# Patient Record
Sex: Female | Born: 1980 | Race: White | Hispanic: No | Marital: Married | Smoking: Never smoker
Health system: Southern US, Community
[De-identification: ages and names within clinical notes are randomized; demographics above are authoritative.]

## PROBLEM LIST (undated history)

## (undated) ENCOUNTER — Emergency Department (HOSPITAL_COMMUNITY): Admission: EM | Payer: BC Managed Care – PPO | Source: Home / Self Care

## (undated) DIAGNOSIS — F32A Depression, unspecified: Secondary | ICD-10-CM

## (undated) DIAGNOSIS — F419 Anxiety disorder, unspecified: Secondary | ICD-10-CM

## (undated) DIAGNOSIS — K589 Irritable bowel syndrome without diarrhea: Secondary | ICD-10-CM

## (undated) DIAGNOSIS — N809 Endometriosis, unspecified: Secondary | ICD-10-CM

## (undated) DIAGNOSIS — E282 Polycystic ovarian syndrome: Secondary | ICD-10-CM

## (undated) DIAGNOSIS — J45909 Unspecified asthma, uncomplicated: Secondary | ICD-10-CM

## (undated) DIAGNOSIS — T7840XA Allergy, unspecified, initial encounter: Secondary | ICD-10-CM

## (undated) DIAGNOSIS — R011 Cardiac murmur, unspecified: Secondary | ICD-10-CM

## (undated) DIAGNOSIS — G43909 Migraine, unspecified, not intractable, without status migrainosus: Secondary | ICD-10-CM

## (undated) DIAGNOSIS — K219 Gastro-esophageal reflux disease without esophagitis: Secondary | ICD-10-CM

## (undated) HISTORY — DX: Allergy, unspecified, initial encounter: T78.40XA

## (undated) HISTORY — DX: Irritable bowel syndrome, unspecified: K58.9

## (undated) HISTORY — DX: Endometriosis, unspecified: N80.9

## (undated) HISTORY — DX: Anxiety disorder, unspecified: F41.9

## (undated) HISTORY — PX: CERVIX LESION DESTRUCTION: SHX591

## (undated) HISTORY — PX: TRIGGER FINGER RELEASE: SHX641

## (undated) HISTORY — DX: Unspecified asthma, uncomplicated: J45.909

## (undated) HISTORY — PX: BARIATRIC SURGERY: SHX1103

## (undated) HISTORY — PX: WISDOM TOOTH EXTRACTION: SHX21

## (undated) HISTORY — DX: Migraine, unspecified, not intractable, without status migrainosus: G43.909

## (undated) HISTORY — DX: Depression, unspecified: F32.A

## (undated) HISTORY — PX: ANTERIOR CRUCIATE LIGAMENT REPAIR: SHX115

## (undated) HISTORY — DX: Cardiac murmur, unspecified: R01.1

## (undated) HISTORY — DX: Gastro-esophageal reflux disease without esophagitis: K21.9

---

## 2009-06-11 ENCOUNTER — Emergency Department (HOSPITAL_COMMUNITY): Admission: EM | Admit: 2009-06-11 | Discharge: 2009-06-11 | Payer: Self-pay | Admitting: Family Medicine

## 2009-11-27 ENCOUNTER — Emergency Department (HOSPITAL_COMMUNITY): Admission: EM | Admit: 2009-11-27 | Discharge: 2009-11-27 | Payer: Self-pay | Admitting: Emergency Medicine

## 2010-04-26 ENCOUNTER — Encounter: Admission: RE | Admit: 2010-04-26 | Discharge: 2010-04-26 | Payer: Self-pay | Admitting: Allergy and Immunology

## 2010-10-08 LAB — STOOL CULTURE

## 2010-10-08 LAB — CLOSTRIDIUM DIFFICILE EIA

## 2010-10-08 LAB — GIARDIA/CRYPTOSPORIDIUM SCREEN(EIA)

## 2010-10-08 LAB — HEMOCCULT GUIAC POC 1CARD (OFFICE): Fecal Occult Bld: POSITIVE

## 2011-03-16 ENCOUNTER — Emergency Department (HOSPITAL_COMMUNITY)
Admission: EM | Admit: 2011-03-16 | Discharge: 2011-03-16 | Discharge status: Absent without leave | Payer: BC Managed Care – PPO | Source: Home / Self Care | Attending: Emergency Medicine | Admitting: Emergency Medicine

## 2011-03-16 ENCOUNTER — Emergency Department (HOSPITAL_COMMUNITY)
Admission: EM | Admit: 2011-03-16 | Discharge: 2011-03-17 | Disposition: A | Discharge status: Home / Self Care | Payer: BC Managed Care – PPO | Attending: Emergency Medicine | Admitting: Emergency Medicine

## 2011-03-16 ENCOUNTER — Ambulatory Visit (HOSPITAL_COMMUNITY)
Admission: RE | Admit: 2011-03-16 | Discharge: 2011-03-16 | Disposition: A | Discharge status: Home / Self Care | Payer: BC Managed Care – PPO | Source: Ambulatory Visit | Attending: Emergency Medicine | Admitting: Emergency Medicine

## 2011-03-16 DIAGNOSIS — R109 Unspecified abdominal pain: Secondary | ICD-10-CM | POA: Insufficient documentation

## 2011-03-16 DIAGNOSIS — M546 Pain in thoracic spine: Secondary | ICD-10-CM | POA: Insufficient documentation

## 2011-03-16 DIAGNOSIS — K219 Gastro-esophageal reflux disease without esophagitis: Secondary | ICD-10-CM | POA: Insufficient documentation

## 2011-03-16 DIAGNOSIS — R110 Nausea: Secondary | ICD-10-CM | POA: Insufficient documentation

## 2011-03-16 LAB — URINALYSIS, ROUTINE W REFLEX MICROSCOPIC
Ketones, ur: 15 mg/dL — AB
Leukocytes, UA: NEGATIVE
Nitrite: NEGATIVE
Protein, ur: NEGATIVE mg/dL

## 2011-03-17 LAB — COMPREHENSIVE METABOLIC PANEL
Albumin: 3.8 g/dL (ref 3.5–5.2)
BUN: 14 mg/dL (ref 6–23)
Chloride: 99 mEq/L (ref 96–112)
Creatinine, Ser: 0.66 mg/dL (ref 0.50–1.10)
GFR calc Af Amer: >60 mL/min (ref >60)
Total Bilirubin: 0.3 mg/dL (ref 0.3–1.2)
Total Protein: 7.5 g/dL (ref 6.0–8.3)

## 2011-03-17 LAB — DIFFERENTIAL
Eosinophils Relative: 0 % (ref 0–5)
Lymphocytes Relative: 16 % (ref 12–46)
Lymphs Abs: 1.6 10*3/uL (ref 0.7–4.0)
Monocytes Absolute: 0.5 10*3/uL (ref 0.1–1.0)

## 2011-03-17 LAB — LIPASE, BLOOD: Lipase: 28 U/L (ref 11–59)

## 2011-03-17 LAB — CBC
HCT: 40.2 % (ref 36.0–46.0)
MCHC: 33.8 g/dL (ref 30.0–36.0)
MCV: 84.8 fL (ref 78.0–100.0)
RDW: 13.6 % (ref 11.5–15.5)

## 2011-03-20 ENCOUNTER — Other Ambulatory Visit: Payer: Self-pay | Admitting: Family Medicine

## 2011-03-20 ENCOUNTER — Ambulatory Visit
Admission: RE | Admit: 2011-03-20 | Discharge: 2011-03-20 | Disposition: A | Discharge status: Home / Self Care | Payer: BC Managed Care – PPO | Source: Ambulatory Visit | Attending: Family Medicine | Admitting: Family Medicine

## 2011-03-20 DIAGNOSIS — R1011 Right upper quadrant pain: Secondary | ICD-10-CM

## 2011-03-25 ENCOUNTER — Other Ambulatory Visit (HOSPITAL_COMMUNITY): Payer: Self-pay | Admitting: Family Medicine

## 2011-03-25 DIAGNOSIS — R1013 Epigastric pain: Secondary | ICD-10-CM

## 2011-04-07 ENCOUNTER — Encounter (HOSPITAL_COMMUNITY)
Admission: RE | Admit: 2011-04-07 | Discharge: 2011-04-07 | Disposition: A | Discharge status: Home / Self Care | Payer: BC Managed Care – PPO | Source: Ambulatory Visit | Attending: Family Medicine | Admitting: Family Medicine

## 2011-04-07 DIAGNOSIS — R1013 Epigastric pain: Secondary | ICD-10-CM | POA: Insufficient documentation

## 2011-04-07 DIAGNOSIS — R110 Nausea: Secondary | ICD-10-CM | POA: Insufficient documentation

## 2011-04-07 MED ORDER — TECHNETIUM TC 99M MEBROFENIN IV KIT
5.2000 | PACK | Freq: Once | INTRAVENOUS | Status: AC | PRN
  Administered 2011-04-07: 5 via INTRAVENOUS

## 2011-07-28 ENCOUNTER — Inpatient Hospital Stay (HOSPITAL_COMMUNITY)
Admission: AD | Admit: 2011-07-28 | Discharge: 2011-07-28 | Disposition: A | Discharge status: Home / Self Care | Payer: BC Managed Care – PPO | Source: Ambulatory Visit | Attending: Obstetrics and Gynecology | Admitting: Obstetrics and Gynecology

## 2011-07-28 ENCOUNTER — Encounter (HOSPITAL_COMMUNITY): Payer: Self-pay | Admitting: *Deleted

## 2011-07-28 ENCOUNTER — Inpatient Hospital Stay (HOSPITAL_COMMUNITY): Payer: BC Managed Care – PPO

## 2011-07-28 DIAGNOSIS — N946 Dysmenorrhea, unspecified: Secondary | ICD-10-CM

## 2011-07-28 HISTORY — DX: Polycystic ovarian syndrome: E28.2

## 2011-07-28 LAB — URINALYSIS, ROUTINE W REFLEX MICROSCOPIC
Glucose, UA: NEGATIVE mg/dL
Leukocytes, UA: NEGATIVE
Protein, ur: NEGATIVE mg/dL
pH: 5.5 (ref 5.0–8.0)

## 2011-07-28 LAB — URINE MICROSCOPIC-ADD ON

## 2011-07-28 LAB — POCT PREGNANCY, URINE: Preg Test, Ur: NEGATIVE

## 2011-07-28 LAB — HCG, SERUM, QUALITATIVE: Preg, Serum: NEGATIVE

## 2011-07-28 MED ORDER — NAPROXEN SODIUM 550 MG PO TABS: 550.0000 mg | ORAL_TABLET | Freq: Two times a day (BID) | ORAL | Status: AC

## 2011-07-28 NOTE — ED Provider Notes (Signed)
History     Chief Complaint  Patient presents with  . Possible Pregnancy   HPI 31 y.o. G0P0 with left pelvic pain starting today, vaginal bleeding starting today. Trying to conceive, stopped OCPs at the end of November. Saw PCP at [facility] today, did pelvic exam, tender in left adnexa. Was told to follow up here.    Past Medical History  Diagnosis Date  . PCOS (polycystic ovarian syndrome)     Past Surgical History  Procedure Date  . Cervix lesion destruction   . Wisdom tooth extraction     Family History  Problem Relation Age of Onset  . COPD Mother   . Cancer Maternal Grandfather     History  Substance Use Topics  . Smoking status: Never Smoker   . Smokeless tobacco: Not on file  . Alcohol Use: Yes     socially    Allergies:  Allergies  Allergen Reactions  . Codeine Itching and Rash  . Sulfa Antibiotics Itching and Rash    Prescriptions prior to admission  Medication Sig Dispense Refill  . Lansoprazole (PREVACID PO) Take 1 tablet by mouth daily as needed. Patient is using this medication for heartburn.       . Prenatal Vit-Fe Fumarate-FA (PRENATAL MULTIVITAMIN) TABS Take 1 tablet by mouth daily.          Review of Systems  Constitutional: Negative.   Respiratory: Negative.   Cardiovascular: Negative.   Gastrointestinal: Positive for abdominal pain. Negative for nausea, vomiting, diarrhea and constipation.  Genitourinary: Negative for dysuria, urgency, frequency, hematuria and flank pain.       Positive for vaginal bleeding  Musculoskeletal: Negative.   Neurological: Negative.   Psychiatric/Behavioral: Negative.    Physical Exam   Blood pressure 131/83, pulse 79, temperature 99.2 F (37.3 C), temperature source Oral, resp. rate 20, height 5' 4.5" (1.638 m), weight 219 lb (99.338 kg), last menstrual period 06/30/2011.  Physical Exam  Nursing note and vitals reviewed. Constitutional: She appears well-developed and well-nourished. No distress.    Cardiovascular: Normal rate.   Respiratory: Effort normal.  GI: Soft. She exhibits no distension and no mass. There is no tenderness. There is no rebound and no guarding.  Genitourinary: Left adnexum displays tenderness.   Results for orders placed during the hospital encounter of 07/28/11 (from the past 24 hour(s))  URINALYSIS, ROUTINE W REFLEX MICROSCOPIC     Status: Abnormal   Collection Time   07/28/11  5:15 PM      Component Value Range   Color, Urine YELLOW  YELLOW    APPearance CLEAR  CLEAR    Specific Gravity, Urine 1.020  1.005 - 1.030    pH 5.5  5.0 - 8.0    Glucose, UA NEGATIVE  NEGATIVE (mg/dL)   Hgb urine dipstick LARGE (*) NEGATIVE    Bilirubin Urine NEGATIVE  NEGATIVE    Ketones, ur NEGATIVE  NEGATIVE (mg/dL)   Protein, ur NEGATIVE  NEGATIVE (mg/dL)   Urobilinogen, UA 0.2  0.0 - 1.0 (mg/dL)   Nitrite NEGATIVE  NEGATIVE    Leukocytes, UA NEGATIVE  NEGATIVE   URINE MICROSCOPIC-ADD ON     Status: Abnormal   Collection Time   07/28/11  5:15 PM      Component Value Range   Squamous Epithelial / LPF FEW (*) RARE    WBC, UA 0-2  <3 (WBC/hpf)   RBC / HPF 21-50  <3 (RBC/hpf)  POCT PREGNANCY, URINE     Status: Normal  Collection Time   07/28/11  5:37 PM      Component Value Range   Preg Test, Ur NEGATIVE    HCG, SERUM, QUALITATIVE     Status: Normal   Collection Time   07/28/11  6:00 PM      Component Value Range   Preg, Serum NEGATIVE  NEGATIVE    US Transvaginal Non-ob  07/28/2011  *RADIOLOGY REPORT*  Clinical Data: Left adnexal pain  TRANSABDOMINAL AND TRANSVAGINAL ULTRASOUND OF PELVIS Technique:  Both transabdominal and transvaginal ultrasound examinations of the pelvis were performed. Transabdominal technique was performed for global imaging of the pelvis including uterus, ovaries, adnexal regions, and pelvic cul-de-sac.  Comparison: None.   It was necessary to proceed with endovaginal exam following the transabdominal exam to visualize the adnexa.  Findings:  Uterus:  Within normal limits for size and appearance.  Anteverted. Normal echogenicity.  No focal mass.  Endometrium: Normal in thickness and appearance. Measures 6 mm maximal thickness.  Right ovary:  Not visualized.  No right adnexal mass is seen.  Left ovary: Measures 3.2 x 1.9 x 1.9 cm.  Contains approximately two follicles.  No cyst or mass.  Normal color Doppler flow is identified to the left ovary.  Other findings: No free fluid  IMPRESSION:  1.  Normal uterus and left ovary. 2.  The right ovary is not visualized.  No right adnexal mass is seen.  Original Report Authenticated By: Britta Mccreedy, M.D.   US Pelvis Complete  07/28/2011  *RADIOLOGY REPORT*  Clinical Data: Left adnexal pain  TRANSABDOMINAL AND TRANSVAGINAL ULTRASOUND OF PELVIS Technique:  Both transabdominal and transvaginal ultrasound examinations of the pelvis were performed. Transabdominal technique was performed for global imaging of the pelvis including uterus, ovaries, adnexal regions, and pelvic cul-de-sac.  Comparison: None.   It was necessary to proceed with endovaginal exam following the transabdominal exam to visualize the adnexa.  Findings:  Uterus: Within normal limits for size and appearance.  Anteverted. Normal echogenicity.  No focal mass.  Endometrium: Normal in thickness and appearance. Measures 6 mm maximal thickness.  Right ovary:  Not visualized.  No right adnexal mass is seen.  Left ovary: Measures 3.2 x 1.9 x 1.9 cm.  Contains approximately two follicles.  No cyst or mass.  Normal color Doppler flow is identified to the left ovary.  Other findings: No free fluid  IMPRESSION:  1.  Normal uterus and left ovary. 2.  The right ovary is not visualized.  No right adnexal mass is seen.  Original Report Authenticated By: Britta Mccreedy, M.D.    MAU Course  Procedures  Assessment and Plan  Pelvic u/s ordered, care assumed by [facility], NP  [facility] 07/28/2011, 7:45 PM   Patient returned from ultrasound, discussed lab and  ultrasound. Pt. To follow up with her doctor. Return here as needed.  Assessment: Dysmenorrhea  Plan:  Anaprox DS    Follow up with PCP.  [place], [place] 07/28/11 2119

## 2011-07-28 NOTE — Progress Notes (Signed)
Unsure if preg,  Woke up this morning with pain and then started bleeding, not like a period.  Pain  On left side with exam, ? Ectopic. Hasn't felt well all day, no fever.

## 2011-07-28 NOTE — Progress Notes (Signed)
Had been having breast changes, tenderness.  Was gone this morning.  Test this morning was neg, 2 prior indeterminate

## 2011-07-28 NOTE — Progress Notes (Signed)
Pelvic pain and amenorrhea, left lower abd pain, pt trying to conceive, sent here from office.

## 2011-07-29 NOTE — ED Provider Notes (Signed)
Agree with above note.  Regina Wise 07/29/2011 7:28 AM   

## 2012-07-01 ENCOUNTER — Ambulatory Visit
Admission: RE | Admit: 2012-07-01 | Discharge: 2012-07-01 | Disposition: A | Discharge status: Home / Self Care | Payer: BC Managed Care – PPO | Source: Ambulatory Visit

## 2012-07-01 ENCOUNTER — Other Ambulatory Visit: Payer: Self-pay

## 2012-07-01 DIAGNOSIS — R102 Pelvic and perineal pain: Secondary | ICD-10-CM

## 2012-07-02 ENCOUNTER — Ambulatory Visit
Admission: RE | Admit: 2012-07-02 | Discharge: 2012-07-02 | Disposition: A | Discharge status: Home / Self Care | Payer: BC Managed Care – PPO | Source: Ambulatory Visit

## 2012-07-02 ENCOUNTER — Other Ambulatory Visit: Payer: Self-pay

## 2012-07-02 DIAGNOSIS — R109 Unspecified abdominal pain: Secondary | ICD-10-CM

## 2013-03-29 ENCOUNTER — Other Ambulatory Visit: Payer: Self-pay | Admitting: Family Medicine

## 2013-03-29 ENCOUNTER — Ambulatory Visit
Admission: RE | Admit: 2013-03-29 | Discharge: 2013-03-29 | Disposition: A | Discharge status: Home / Self Care | Payer: BC Managed Care – PPO | Source: Ambulatory Visit | Attending: Family Medicine | Admitting: Family Medicine

## 2013-03-29 DIAGNOSIS — M79672 Pain in left foot: Secondary | ICD-10-CM

## 2013-05-23 ENCOUNTER — Ambulatory Visit (HOSPITAL_COMMUNITY)
Admission: RE | Admit: 2013-05-23 | Discharge: 2013-05-23 | Disposition: A | Discharge status: Home / Self Care | Payer: BC Managed Care – PPO | Attending: Psychiatry | Admitting: Psychiatry

## 2013-05-23 NOTE — BH Assessment (Signed)
Writer consulted with Lloyd Huger (PA) regarding this patient not meeting criteria for inpatient hospitalization.  Patient was referred to Intensive Outpatient Therapy at [facility].

## 2013-05-23 NOTE — BH Assessment (Signed)
Walk In ssessment Note   Patient is a 32 year old white female that reports increased level of stress and depression associated with workplace conflict.  Patient reports incidents of depression for the past 12 weeks. Patient reports slights SI without a plan.  Patient reports that she is able to contract for safety.   Patient denies a prior history of psychiatric hospitalization.  Patient denies a past history of SI.  Patient reports medication management at her family doctor [facility]).  Patient reports that the medication (Wellbuterin) does not seem to be working.     Patient reports that certain staff members have been ganging up on her and she feels as if she is being bullied while at work.  Patient reports that she has spoken to her union representative because she feels as if she is in a hostile workplace.  Patient endorsed increased weight gain, loss of sleep, chronic mood swings and depression.   Patient denies HI.  Patient denies psychosis.   Axis I: Major Depression, single episode Axis II: Deferred Axis III:  Past Medical History  Diagnosis Date  . PCOS (polycystic ovarian syndrome)    Axis IV: occupational problems, other psychosocial or environmental problems and problems related to social environment Axis V: 41-50 serious symptoms  Past Medical History:  Past Medical History  Diagnosis Date  . PCOS (polycystic ovarian syndrome)     Past Surgical History  Procedure Laterality Date  . Cervix lesion destruction    . Wisdom tooth extraction      Family History:  Family History  Problem Relation Age of Onset  . COPD Mother   . Cancer Maternal Grandfather     Social History:  reports that she has never smoked. She does not have any smokeless tobacco history on file. She reports that she drinks alcohol. She reports that she does not use illicit drugs.  Additional Social History:     CIWA:   COWS:    Allergies:  Allergies  Allergen Reactions  . Codeine Itching  and Rash  . Sulfa Antibiotics Itching and Rash    Home Medications:  (Not in a hospital admission)  OB/GYN Status:  No LMP recorded.  General Assessment Data Location of Assessment: [facility] Services Is this a Tele or Face-to-Face Assessment?: Face-to-Face Is this an Initial Assessment or a Re-assessment for this encounter?: Initial Assessment Living Arrangements: Spouse/significant other Can pt return to current living arrangement?: Yes Admission Status: Voluntary Is patient capable of signing voluntary admission?: Yes Transfer from: Home Referral Source: Self/Family/Friend  Medical Screening Exam [facility] Walk-in ONLY) Medical Exam completed: No Reason for MSE not completed: Patient Refused  [facility] Crisis Care Plan Living Arrangements: Spouse/significant other  Education Status Is patient currently in school?: No  Risk to self Suicidal Ideation: Yes-Currently Present Suicidal Intent: No Is patient at risk for suicide?: No Suicidal Plan?: No Access to Means: No What has been your use of drugs/alcohol within the last 12 months?: None Previous Attempts/Gestures: No How many times?: 0 Other Self Harm Risks: None Triggers for Past Attempts: Unpredictable Intentional Self Injurious Behavior: None Family Suicide History: No Recent stressful life event(s): Conflict (Comment) (Problems at work with co workers and superiors) Persecutory voices/beliefs?: No Depression: Yes Depression Symptoms: Despondent;Insomnia;Tearfulness;Isolating;Loss of interest in usual pleasures;Feeling worthless/self pity;Feeling angry/irritable;Fatigue Substance abuse history and/or treatment for substance abuse?: No Suicide prevention information given to non-admitted patients: Yes  Risk to Others Homicidal Ideation: No Thoughts of Harm to Others: No Current Homicidal Intent: No Current  Homicidal Plan: No Access to Homicidal Means: No Identified Victim: None History of harm to others?:  No Assessment of Violence: None Noted Violent Behavior Description: calm Does patient have access to weapons?: No Criminal Charges Pending?: No Does patient have a court date: No  Psychosis Hallucinations: None noted Delusions: None noted  Mental Status Report Appear/Hygiene: Disheveled Eye Contact: Good Motor Activity: Freedom of movement Speech: Logical/coherent Level of Consciousness: Alert Mood: Depressed Affect: Blunted Anxiety Level: None Thought Processes: Coherent;Relevant Judgement: Unimpaired Orientation: Place;Person;Time;Situation Obsessive Compulsive Thoughts/Behaviors: None  Cognitive Functioning Concentration: Decreased Memory: Recent Intact;Remote Intact IQ: Average Insight: Fair Impulse Control: Fair Appetite: Fair Weight Loss: 0 Weight Gain: 50 Sleep: Decreased Total Hours of Sleep: 4 Vegetative Symptoms: Staying in bed  ADLScreening [facility] Assessment Services) Patient's cognitive ability adequate to safely complete daily activities?: Yes Patient able to express need for assistance with ADLs?: Yes Independently performs ADLs?: Yes (appropriate for developmental age)  Prior Inpatient Therapy Prior Inpatient Therapy: No Prior Therapy Dates: na Prior Therapy Facilty/Provider(s): na Reason for Treatment: na  Prior Outpatient Therapy Prior Outpatient Therapy: Yes Prior Therapy Dates: ongoing  Prior Therapy Facilty/Provider(s): [facility] Reason for Treatment: Medication Management and Therapy  ADL Screening (condition at time of admission) Patient's cognitive ability adequate to safely complete daily activities?: Yes Patient able to express need for assistance with ADLs?: Yes Independently performs ADLs?: Yes (appropriate for developmental age)                  Additional Information 1:1 In Past 12 Months?: No CIRT Risk: No Elopement Risk: No Does patient have medical clearance?: Yes     Disposition:   Disposition Initial Assessment Completed for this Encounter: Yes Disposition of Patient: Other dispositions Type of inpatient treatment program:  (N/A) Other disposition(s): Other (Comment);Referred to outside facility ([facility])  On Site Evaluation by:   Reviewed with Physician:    Regina Wise 05/23/2013 12:14 PM

## 2013-06-21 ENCOUNTER — Emergency Department (HOSPITAL_COMMUNITY)
Admission: EM | Admit: 2013-06-21 | Discharge: 2013-06-21 | Disposition: A | Discharge status: Home / Self Care | Payer: BC Managed Care – PPO | Attending: Emergency Medicine | Admitting: Emergency Medicine

## 2013-06-21 ENCOUNTER — Emergency Department (HOSPITAL_COMMUNITY): Payer: BC Managed Care – PPO

## 2013-06-21 ENCOUNTER — Encounter (HOSPITAL_COMMUNITY): Payer: Self-pay | Admitting: Emergency Medicine

## 2013-06-21 DIAGNOSIS — N949 Unspecified condition associated with female genital organs and menstrual cycle: Secondary | ICD-10-CM | POA: Insufficient documentation

## 2013-06-21 DIAGNOSIS — R319 Hematuria, unspecified: Secondary | ICD-10-CM | POA: Insufficient documentation

## 2013-06-21 DIAGNOSIS — R102 Pelvic and perineal pain: Secondary | ICD-10-CM

## 2013-06-21 DIAGNOSIS — Z8742 Personal history of other diseases of the female genital tract: Secondary | ICD-10-CM | POA: Insufficient documentation

## 2013-06-21 DIAGNOSIS — R1012 Left upper quadrant pain: Secondary | ICD-10-CM | POA: Insufficient documentation

## 2013-06-21 DIAGNOSIS — R109 Unspecified abdominal pain: Secondary | ICD-10-CM

## 2013-06-21 DIAGNOSIS — Z79899 Other long term (current) drug therapy: Secondary | ICD-10-CM | POA: Insufficient documentation

## 2013-06-21 DIAGNOSIS — R300 Dysuria: Secondary | ICD-10-CM

## 2013-06-21 LAB — URINALYSIS, ROUTINE W REFLEX MICROSCOPIC
Bilirubin Urine: NEGATIVE
Glucose, UA: NEGATIVE mg/dL
Ketones, ur: >80 mg/dL — AB
Nitrite: NEGATIVE
Protein, ur: NEGATIVE mg/dL
Specific Gravity, Urine: 1.024 (ref 1.005–1.030)
Urobilinogen, UA: 1 mg/dL (ref 0.0–1.0)
pH: 5.5 (ref 5.0–8.0)

## 2013-06-21 LAB — URINE MICROSCOPIC-ADD ON

## 2013-06-21 LAB — CBC WITH DIFFERENTIAL/PLATELET
Basophils Absolute: 0 10*3/uL (ref 0.0–0.1)
Basophils Relative: 0 % (ref 0–1)
HCT: 42.4 % (ref 36.0–46.0)
Hemoglobin: 14.1 g/dL (ref 12.0–15.0)
Lymphocytes Relative: 15 % (ref 12–46)
MCHC: 33.3 g/dL (ref 30.0–36.0)
Neutro Abs: 10.9 10*3/uL — ABNORMAL HIGH (ref 1.7–7.7)
Neutrophils Relative %: 81 % — ABNORMAL HIGH (ref 43–77)
RDW: 14.7 % (ref 11.5–15.5)
WBC: 13.5 10*3/uL — ABNORMAL HIGH (ref 4.0–10.5)

## 2013-06-21 LAB — COMPREHENSIVE METABOLIC PANEL
ALT: 24 U/L (ref 0–35)
AST: 22 U/L (ref 0–37)
Albumin: 4.2 g/dL (ref 3.5–5.2)
Alkaline Phosphatase: 67 U/L (ref 39–117)
CO2: 24 mEq/L (ref 19–32)
Chloride: 99 mEq/L (ref 96–112)
GFR calc non Af Amer: >90 mL/min (ref >90)
Potassium: 4 mEq/L (ref 3.5–5.1)
Total Bilirubin: 0.3 mg/dL (ref 0.3–1.2)

## 2013-06-21 LAB — POCT PREGNANCY, URINE: Preg Test, Ur: NEGATIVE

## 2013-06-21 MED ORDER — IOHEXOL 300 MG/ML  SOLN: 50.0000 mL | Freq: Once | INTRAMUSCULAR | Status: DC | PRN

## 2013-06-21 MED ORDER — ONDANSETRON HCL 4 MG/2ML IJ SOLN
4.0000 mg | Freq: Once | INTRAMUSCULAR | Status: AC
  Administered 2013-06-21: 4 mg via INTRAVENOUS
  Filled 2013-06-21: qty 2

## 2013-06-21 MED ORDER — CEPHALEXIN 500 MG PO CAPS: 500.0000 mg | ORAL_CAPSULE | Freq: Four times a day (QID) | ORAL | Status: DC

## 2013-06-21 MED ORDER — OXYCODONE-ACETAMINOPHEN 5-325 MG PO TABS: 1.0000 | ORAL_TABLET | ORAL | Status: DC | PRN

## 2013-06-21 MED ORDER — MORPHINE SULFATE 4 MG/ML IJ SOLN
4.0000 mg | Freq: Once | INTRAMUSCULAR | Status: AC
  Administered 2013-06-21: 4 mg via INTRAVENOUS
  Filled 2013-06-21: qty 1

## 2013-06-21 MED ORDER — PHENAZOPYRIDINE HCL 200 MG PO TABS: 200.0000 mg | ORAL_TABLET | Freq: Three times a day (TID) | ORAL | Status: DC

## 2013-06-21 NOTE — ED Notes (Signed)
Pt c/o lower abdomen pain x 3 days. Got worse today. Went to [facility] and had an ultrasound and UA and nothing was found. Denies n/v/d but has been constipated over the last 3 days.

## 2013-06-21 NOTE — Progress Notes (Signed)
   CARE MANAGEMENT ED NOTE 06/21/2013  Patient:  Regina Wise, Regina Wise   Account Number:  [id number]  Date Initiated:  06/21/2013  Documentation initiated by:  Radford Pax  Subjective/Objective Assessment:   Patient presents to Ed with abominal pain for three days.     Subjective/Objective Assessment Detail:     Action/Plan:   Action/Plan Detail:   Anticipated DC Date:       Status Recommendation to Physician:   Result of Recommendation:    Other ED Services  Consult Working Plan    DC Planning Services  Other  PCP issues    Choice offered to / List presented to:            Status of service:  Completed, signed off  ED Comments:   ED Comments Detail:  Patient confirms her pcp is Dr. Hyman Hopes of [facility].  System updated.

## 2013-06-21 NOTE — ED Provider Notes (Signed)
CSN: [id number]     Arrival date & time 06/21/13  1437 History   First MD Initiated Contact with Patient 06/21/13 1531     Chief Complaint  Patient presents with  . Abdominal Pain   (Consider location/radiation/quality/duration/timing/severity/associated sxs/prior Treatment) HPI Comments: Patient presents to the ER for evaluation of abdominal pain. Patient reports that symptoms began 3 days ago. She reports that she has been feeling constipated and having left upper abdominal pain which has been crampy and intermittent. She also had some dull aching and burning pain in the area of her vagina and urethra. The patient reports that she saw her OB/GYN this morning. She had a pelvic exam, urinalysis and ultrasound. She was told everything looked normal. Since then, however, she has had onset of severe, constant pain in the area of the vagina. It hurts more when she is about to urinate.  Patient is a 32 y.o. female presenting with abdominal pain.  Abdominal Pain Associated symptoms: dysuria and hematuria     Past Medical History  Diagnosis Date  . PCOS (polycystic ovarian syndrome)    Past Surgical History  Procedure Laterality Date  . Cervix lesion destruction    . Wisdom tooth extraction     Family History  Problem Relation Age of Onset  . COPD Mother   . Cancer Maternal Grandfather    History  Substance Use Topics  . Smoking status: Never Smoker   . Smokeless tobacco: Not on file  . Alcohol Use: Yes     Comment: socially   OB History   Grav Para Term Preterm Abortions TAB SAB Ect Mult Living   0              Review of Systems  Gastrointestinal: Positive for abdominal pain.  Genitourinary: Positive for dysuria and hematuria.  All other systems reviewed and are negative.    Allergies  Pineapple; Codeine; and Sulfa antibiotics  Home Medications   Current Outpatient Rx  Name  Route  Sig  Dispense  Refill  . buPROPion (WELLBUTRIN XL) 300 MG 24 hr tablet   Oral   Take  300 mg by mouth daily.         . lansoprazole (PREVACID) 30 MG capsule   Oral   Take 30 mg by mouth daily at 12 noon.         . norethindrone-ethinyl estradiol (NECON,BREVICON,MODICON) 0.5-35 MG-MCG tablet   Oral   Take 1 tablet by mouth daily.         . Prenatal Vit-Fe Fumarate-FA (PRENATAL MULTIVITAMIN) TABS   Oral   Take 1 tablet by mouth daily.           . Sennosides (EX-LAX PO)   Oral   Take 2 tablets by mouth daily as needed (constipation).          BP 123/89  Pulse 112  Temp(Src) 97.7 F (36.5 C) (Oral)  Resp 20  SpO2 100%  LMP 06/01/2013 Physical Exam  Constitutional: She is oriented to person, place, and time. She appears well-developed and well-nourished. No distress.  HENT:  Head: Normocephalic and atraumatic.  Right Ear: Hearing normal.  Left Ear: Hearing normal.  Nose: Nose normal.  Mouth/Throat: Oropharynx is clear and moist and mucous membranes are normal.  Eyes: Conjunctivae and EOM are normal. Pupils are equal, round, and reactive to light.  Neck: Normal range of motion. Neck supple.  Cardiovascular: Regular rhythm, S1 normal and S2 normal.  Exam reveals no gallop and no friction rub.  No murmur heard. Pulmonary/Chest: Effort normal and breath sounds normal. No respiratory distress. She exhibits no tenderness.  Abdominal: Soft. Normal appearance and bowel sounds are normal. There is no hepatosplenomegaly. There is no tenderness. There is no rebound, no guarding, no tenderness at McBurney's point and negative Murphy's sign. No hernia.  Musculoskeletal: Normal range of motion.  Neurological: She is alert and oriented to person, place, and time. She has normal strength. No cranial nerve deficit or sensory deficit. Coordination normal. GCS eye subscore is 4. GCS verbal subscore is 5. GCS motor subscore is 6.  Skin: Skin is warm, dry and intact. No rash noted. No cyanosis.  Psychiatric: She has a normal mood and affect. Her speech is normal and  behavior is normal. Thought content normal.    ED Course  Procedures (including critical care time) Labs Review Labs Reviewed  CBC WITH DIFFERENTIAL - Abnormal; Notable for the following:    WBC 13.5 (*)    Neutrophils Relative % 81 (*)    Neutro Abs 10.9 (*)    All other components within normal limits  COMPREHENSIVE METABOLIC PANEL - Abnormal; Notable for the following:    Total Protein 8.7 (*)    All other components within normal limits  URINALYSIS, ROUTINE W REFLEX MICROSCOPIC - Abnormal; Notable for the following:    APPearance CLOUDY (*)    Hgb urine dipstick SMALL (*)    Ketones, ur >80 (*)    Leukocytes, UA TRACE (*)    All other components within normal limits  LIPASE, BLOOD  URINE MICROSCOPIC-ADD ON  POCT PREGNANCY, URINE   Imaging Review Ct Abdomen Pelvis Wo Contrast  06/21/2013   CLINICAL DATA:  Three days of lower abdominal discomfort and constipation, normal outside pelvic ultrasound earlier today  EXAM: CT ABDOMEN AND PELVIS WITHOUT CONTRAST  TECHNIQUE: Multidetector CT imaging of the abdomen and pelvis was performed following the standard protocol without intravenous contrast.  COMPARISON:  CT scan of the abdomen and pelvis dated 13 2013.  FINDINGS: The kidneys are normal in density and contour. No calcified stones nor hydronephrosis is demonstrated. The perinephric fat is normal in appearance. Along the expected course of the ureters no abnormal calcifications are demonstrated. There is a phlebolith in the right adnexal region which is a stable finding. The uterus and adnexal structures appear normal. The partially distended urinary bladder is grossly normal.  The liver exhibits no focal mass nor ductal dilation. The gallbladder is adequately distended and exhibits no calcified stones. The spleen, nondistended stomach, pancreas, and adrenal glands are normal in appearance. The caliber of the abdominal aorta is normal. There is no periaortic or pericaval lymphadenopathy.   The non-opacified loops of small and large bowel exhibit no evidence of ileus nor obstruction. A normal caliber uninflamed appearing appendix is demonstrated on images 70-72 of series 2. The lumbar vertebral bodies are preserved in height. The bony pelvis is normal in appearance. The lung bases are clear.  IMPRESSION: 1. There is no evidence of urinary tract stones nor obstruction. There are no findings to suggest pyelonephritis based on the normal appearance of the perinephric fat. No acute gynecological abnormality is demonstrated. 2. No acute hepatobiliary abnormality is demonstrated. 3. There is no evidence of bowel obstruction or ileus. No significant stool burden is present. A normal caliber uninflamed appearing appendix is demonstrated. 4. There is no evidence of intra-abdominal or pelvic free fluid, abscess formation, or lymphadenopathy.   Electronically Signed   By: David  [place]   On:  06/21/2013 17:06    EKG Interpretation   None       MDM   1. Abdominal pain   2. Pelvic pain   3. Dysuria    Patient with abdominal and pelvic pain. Patient has been expressing abdominal pain for several weeks. She was seen by GI and had upper endoscopy, no acute findings. Today she was seen by her gynecologist and had a pelvic exam and pelvic ultrasound without findings. Patient has had increased pain today, specifically says the pain is in the area of her urethra. She has increased pain with urination. Urinalysis is equivocal. CAT scan was performed to further evaluate. No acute abnormalities are seen. No signs of pyelonephritis, ureterolithiasis. No appendicitis, diverticulitis or other inflammatory process. Patient's pain did begin to improve spontaneously prior to administration of the morphine and now she feels well. Cannot rule out passed kidney stone, but there is no residual hydronephrosis or hydroureter. As the patient has had significant dysuria, will treat empirically with Keflex while culture is  pending. Prescribed Pyridium and Percocet to be used as needed for pain. She has followup with GI scheduled for later in the week.    Gilda Crease, MD 06/21/13 1730

## 2018-04-21 NOTE — Progress Notes (Signed)
Ms Hickson received her flu shot by the undersigned to LT deltoid:  Lot #[id number] Mfg: [id number] NDC:58160-896-41 Exp:01/18/19

## 2019-06-22 ENCOUNTER — Other Ambulatory Visit: Payer: Self-pay

## 2019-06-22 DIAGNOSIS — Z20822 Contact with and (suspected) exposure to covid-19: Secondary | ICD-10-CM

## 2019-06-25 LAB — NOVEL CORONAVIRUS, NAA: SARS-CoV-2, NAA: NOT DETECTED

## 2020-03-25 DIAGNOSIS — Z20822 Contact with and (suspected) exposure to covid-19: Secondary | ICD-10-CM | POA: Diagnosis not present

## 2020-04-04 DIAGNOSIS — R194 Change in bowel habit: Secondary | ICD-10-CM | POA: Diagnosis not present

## 2020-04-04 DIAGNOSIS — R1031 Right lower quadrant pain: Secondary | ICD-10-CM | POA: Diagnosis not present

## 2020-04-04 DIAGNOSIS — R1032 Left lower quadrant pain: Secondary | ICD-10-CM | POA: Diagnosis not present

## 2020-04-16 DIAGNOSIS — R194 Change in bowel habit: Secondary | ICD-10-CM | POA: Diagnosis not present

## 2020-04-16 DIAGNOSIS — K5900 Constipation, unspecified: Secondary | ICD-10-CM | POA: Diagnosis not present

## 2020-04-16 DIAGNOSIS — K648 Other hemorrhoids: Secondary | ICD-10-CM | POA: Diagnosis not present

## 2020-05-07 DIAGNOSIS — K5902 Outlet dysfunction constipation: Secondary | ICD-10-CM | POA: Diagnosis not present

## 2020-05-20 DIAGNOSIS — R059 Cough, unspecified: Secondary | ICD-10-CM | POA: Diagnosis not present

## 2020-05-20 DIAGNOSIS — R509 Fever, unspecified: Secondary | ICD-10-CM | POA: Diagnosis not present

## 2020-05-20 DIAGNOSIS — J0110 Acute frontal sinusitis, unspecified: Secondary | ICD-10-CM | POA: Diagnosis not present

## 2020-05-20 DIAGNOSIS — Z20822 Contact with and (suspected) exposure to covid-19: Secondary | ICD-10-CM | POA: Diagnosis not present

## 2020-05-30 DIAGNOSIS — Z6841 Body Mass Index (BMI) 40.0 and over, adult: Secondary | ICD-10-CM | POA: Diagnosis not present

## 2020-05-30 DIAGNOSIS — Z01419 Encounter for gynecological examination (general) (routine) without abnormal findings: Secondary | ICD-10-CM | POA: Diagnosis not present

## 2020-05-31 DIAGNOSIS — Z23 Encounter for immunization: Secondary | ICD-10-CM | POA: Diagnosis not present

## 2020-05-31 DIAGNOSIS — Z0000 Encounter for general adult medical examination without abnormal findings: Secondary | ICD-10-CM | POA: Diagnosis not present

## 2020-05-31 DIAGNOSIS — Z13220 Encounter for screening for lipoid disorders: Secondary | ICD-10-CM | POA: Diagnosis not present

## 2020-05-31 DIAGNOSIS — Z5181 Encounter for therapeutic drug level monitoring: Secondary | ICD-10-CM | POA: Diagnosis not present

## 2020-06-11 DIAGNOSIS — F419 Anxiety disorder, unspecified: Secondary | ICD-10-CM | POA: Diagnosis not present

## 2020-06-11 DIAGNOSIS — M6289 Other specified disorders of muscle: Secondary | ICD-10-CM | POA: Diagnosis not present

## 2020-06-11 DIAGNOSIS — M62838 Other muscle spasm: Secondary | ICD-10-CM | POA: Diagnosis not present

## 2020-06-11 DIAGNOSIS — M6281 Muscle weakness (generalized): Secondary | ICD-10-CM | POA: Diagnosis not present

## 2020-07-27 DIAGNOSIS — M67912 Unspecified disorder of synovium and tendon, left shoulder: Secondary | ICD-10-CM | POA: Diagnosis not present

## 2020-08-03 DIAGNOSIS — M62838 Other muscle spasm: Secondary | ICD-10-CM | POA: Diagnosis not present

## 2020-08-03 DIAGNOSIS — R1030 Lower abdominal pain, unspecified: Secondary | ICD-10-CM | POA: Diagnosis not present

## 2020-08-03 DIAGNOSIS — M6281 Muscle weakness (generalized): Secondary | ICD-10-CM | POA: Diagnosis not present

## 2020-08-03 DIAGNOSIS — M6289 Other specified disorders of muscle: Secondary | ICD-10-CM | POA: Diagnosis not present

## 2020-08-14 DIAGNOSIS — K582 Mixed irritable bowel syndrome: Secondary | ICD-10-CM | POA: Diagnosis not present

## 2020-08-14 DIAGNOSIS — F419 Anxiety disorder, unspecified: Secondary | ICD-10-CM | POA: Diagnosis not present

## 2020-08-14 DIAGNOSIS — K5904 Chronic idiopathic constipation: Secondary | ICD-10-CM | POA: Diagnosis not present

## 2020-08-14 DIAGNOSIS — F840 Autistic disorder: Secondary | ICD-10-CM | POA: Diagnosis not present

## 2020-08-17 DIAGNOSIS — M67912 Unspecified disorder of synovium and tendon, left shoulder: Secondary | ICD-10-CM | POA: Diagnosis not present

## 2020-08-20 DIAGNOSIS — M6281 Muscle weakness (generalized): Secondary | ICD-10-CM | POA: Diagnosis not present

## 2020-08-20 DIAGNOSIS — M62838 Other muscle spasm: Secondary | ICD-10-CM | POA: Diagnosis not present

## 2020-08-20 DIAGNOSIS — M6289 Other specified disorders of muscle: Secondary | ICD-10-CM | POA: Diagnosis not present

## 2020-08-20 DIAGNOSIS — K5900 Constipation, unspecified: Secondary | ICD-10-CM | POA: Diagnosis not present

## 2020-09-03 DIAGNOSIS — M25512 Pain in left shoulder: Secondary | ICD-10-CM | POA: Diagnosis not present

## 2020-09-05 DIAGNOSIS — M67912 Unspecified disorder of synovium and tendon, left shoulder: Secondary | ICD-10-CM | POA: Diagnosis not present

## 2020-09-21 DIAGNOSIS — M6289 Other specified disorders of muscle: Secondary | ICD-10-CM | POA: Diagnosis not present

## 2020-09-21 DIAGNOSIS — Z23 Encounter for immunization: Secondary | ICD-10-CM | POA: Diagnosis not present

## 2020-09-21 DIAGNOSIS — M62838 Other muscle spasm: Secondary | ICD-10-CM | POA: Diagnosis not present

## 2020-09-21 DIAGNOSIS — N393 Stress incontinence (female) (male): Secondary | ICD-10-CM | POA: Diagnosis not present

## 2020-09-21 DIAGNOSIS — R350 Frequency of micturition: Secondary | ICD-10-CM | POA: Diagnosis not present

## 2020-09-21 DIAGNOSIS — S6992XA Unspecified injury of left wrist, hand and finger(s), initial encounter: Secondary | ICD-10-CM | POA: Diagnosis not present

## 2020-10-03 DIAGNOSIS — J0101 Acute recurrent maxillary sinusitis: Secondary | ICD-10-CM | POA: Diagnosis not present

## 2020-10-08 ENCOUNTER — Other Ambulatory Visit: Payer: Self-pay

## 2020-10-08 ENCOUNTER — Encounter (HOSPITAL_BASED_OUTPATIENT_CLINIC_OR_DEPARTMENT_OTHER): Payer: Self-pay | Admitting: Obstetrics and Gynecology

## 2020-10-08 ENCOUNTER — Emergency Department (HOSPITAL_BASED_OUTPATIENT_CLINIC_OR_DEPARTMENT_OTHER): Payer: BC Managed Care – PPO | Admitting: Radiology

## 2020-10-08 ENCOUNTER — Emergency Department (HOSPITAL_BASED_OUTPATIENT_CLINIC_OR_DEPARTMENT_OTHER)
Admission: EM | Admit: 2020-10-08 | Discharge: 2020-10-08 | Disposition: A | Discharge status: Home / Self Care | Payer: BC Managed Care – PPO | Attending: Emergency Medicine | Admitting: Emergency Medicine

## 2020-10-08 DIAGNOSIS — M25561 Pain in right knee: Secondary | ICD-10-CM | POA: Diagnosis not present

## 2020-10-08 DIAGNOSIS — M79661 Pain in right lower leg: Secondary | ICD-10-CM | POA: Diagnosis not present

## 2020-10-08 DIAGNOSIS — Z041 Encounter for examination and observation following transport accident: Secondary | ICD-10-CM | POA: Diagnosis not present

## 2020-10-08 DIAGNOSIS — Y92410 Unspecified street and highway as the place of occurrence of the external cause: Secondary | ICD-10-CM | POA: Insufficient documentation

## 2020-10-08 DIAGNOSIS — S20312A Abrasion of left front wall of thorax, initial encounter: Secondary | ICD-10-CM | POA: Diagnosis not present

## 2020-10-08 DIAGNOSIS — S81011A Laceration without foreign body, right knee, initial encounter: Secondary | ICD-10-CM

## 2020-10-08 MED ORDER — TRAMADOL HCL 50 MG PO TABS: 50.0000 mg | ORAL_TABLET | Freq: Four times a day (QID) | ORAL | 0 refills | Status: DC | PRN

## 2020-10-08 MED ORDER — TRAMADOL HCL 50 MG PO TABS
50.0000 mg | ORAL_TABLET | Freq: Once | ORAL | Status: AC
Start: 2020-10-08 — End: 2020-10-08
  Administered 2020-10-08: 50 mg via ORAL
  Filled 2020-10-08: qty 1

## 2020-10-08 MED ORDER — LIDOCAINE-EPINEPHRINE (PF) 2 %-1:200000 IJ SOLN
10.0000 mL | Freq: Once | INTRAMUSCULAR | Status: AC
  Administered 2020-10-08: 10 mL
  Filled 2020-10-08: qty 20
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
## 2020-10-08 NOTE — Discharge Instructions (Addendum)
You have been seen and discharged from the emergency department.  Your right knee laceration was repaired with 2 Prolene sutures.  Keep this wound wrapped and dry for 24 hours.  After that you may unwrap the wound, let soapy water run over it in the shower, pat to dry.  The sutures will need to be removed by your primary doctor in 7 days.  Continue taking your oral antibiotic as directed.  Remember to rest, elevate and ice the knee.  If you are still having knee pain and difficulty ambulating need to follow-up with your primary doctor and orthopedics for further imaging/evaluation. Take pain medicine as directed.  Do not mix this pain medicine with alcohol, it could be sedating.  Take home medications as prescribed. If you have any worsening symptoms or further concerns for health please return to an emergency department for further evaluation.
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
## 2020-10-09 DIAGNOSIS — M25561 Pain in right knee: Secondary | ICD-10-CM | POA: Diagnosis not present

## 2020-10-16 DIAGNOSIS — Z4802 Encounter for removal of sutures: Secondary | ICD-10-CM | POA: Diagnosis not present

## 2020-10-18 DIAGNOSIS — M25561 Pain in right knee: Secondary | ICD-10-CM | POA: Diagnosis not present

## 2020-10-24 DIAGNOSIS — M25561 Pain in right knee: Secondary | ICD-10-CM | POA: Diagnosis not present

## 2020-11-13 DIAGNOSIS — S46012D Strain of muscle(s) and tendon(s) of the rotator cuff of left shoulder, subsequent encounter: Secondary | ICD-10-CM | POA: Diagnosis not present

## 2020-11-13 DIAGNOSIS — M25612 Stiffness of left shoulder, not elsewhere classified: Secondary | ICD-10-CM | POA: Diagnosis not present

## 2020-11-13 DIAGNOSIS — M6281 Muscle weakness (generalized): Secondary | ICD-10-CM | POA: Diagnosis not present

## 2020-11-30 DIAGNOSIS — S83511A Sprain of anterior cruciate ligament of right knee, initial encounter: Secondary | ICD-10-CM | POA: Diagnosis not present

## 2020-12-11 DIAGNOSIS — F331 Major depressive disorder, recurrent, moderate: Secondary | ICD-10-CM | POA: Diagnosis not present

## 2020-12-20 DIAGNOSIS — X58XXXA Exposure to other specified factors, initial encounter: Secondary | ICD-10-CM | POA: Diagnosis not present

## 2020-12-20 DIAGNOSIS — S83281A Other tear of lateral meniscus, current injury, right knee, initial encounter: Secondary | ICD-10-CM | POA: Diagnosis not present

## 2020-12-20 DIAGNOSIS — G8918 Other acute postprocedural pain: Secondary | ICD-10-CM | POA: Diagnosis not present

## 2020-12-20 DIAGNOSIS — Y999 Unspecified external cause status: Secondary | ICD-10-CM | POA: Diagnosis not present

## 2020-12-20 DIAGNOSIS — S83511A Sprain of anterior cruciate ligament of right knee, initial encounter: Secondary | ICD-10-CM | POA: Diagnosis not present

## 2020-12-20 DIAGNOSIS — S83261A Peripheral tear of lateral meniscus, current injury, right knee, initial encounter: Secondary | ICD-10-CM | POA: Diagnosis not present

## 2020-12-26 DIAGNOSIS — Z9889 Other specified postprocedural states: Secondary | ICD-10-CM | POA: Diagnosis not present

## 2020-12-27 DIAGNOSIS — F331 Major depressive disorder, recurrent, moderate: Secondary | ICD-10-CM | POA: Diagnosis not present

## 2021-01-01 DIAGNOSIS — M25661 Stiffness of right knee, not elsewhere classified: Secondary | ICD-10-CM | POA: Diagnosis not present

## 2021-01-01 DIAGNOSIS — S83511D Sprain of anterior cruciate ligament of right knee, subsequent encounter: Secondary | ICD-10-CM | POA: Diagnosis not present

## 2021-01-01 DIAGNOSIS — M6281 Muscle weakness (generalized): Secondary | ICD-10-CM | POA: Diagnosis not present

## 2021-01-03 DIAGNOSIS — M25661 Stiffness of right knee, not elsewhere classified: Secondary | ICD-10-CM | POA: Diagnosis not present

## 2021-01-03 DIAGNOSIS — S83511D Sprain of anterior cruciate ligament of right knee, subsequent encounter: Secondary | ICD-10-CM | POA: Diagnosis not present

## 2021-01-03 DIAGNOSIS — M6281 Muscle weakness (generalized): Secondary | ICD-10-CM | POA: Diagnosis not present

## 2021-01-08 DIAGNOSIS — M6281 Muscle weakness (generalized): Secondary | ICD-10-CM | POA: Diagnosis not present

## 2021-01-08 DIAGNOSIS — M25661 Stiffness of right knee, not elsewhere classified: Secondary | ICD-10-CM | POA: Diagnosis not present

## 2021-01-08 DIAGNOSIS — S83511D Sprain of anterior cruciate ligament of right knee, subsequent encounter: Secondary | ICD-10-CM | POA: Diagnosis not present

## 2021-01-10 DIAGNOSIS — F331 Major depressive disorder, recurrent, moderate: Secondary | ICD-10-CM | POA: Diagnosis not present

## 2021-01-18 DIAGNOSIS — M545 Low back pain, unspecified: Secondary | ICD-10-CM | POA: Diagnosis not present

## 2021-01-23 DIAGNOSIS — M6281 Muscle weakness (generalized): Secondary | ICD-10-CM | POA: Diagnosis not present

## 2021-01-23 DIAGNOSIS — M25661 Stiffness of right knee, not elsewhere classified: Secondary | ICD-10-CM | POA: Diagnosis not present

## 2021-01-23 DIAGNOSIS — S83511D Sprain of anterior cruciate ligament of right knee, subsequent encounter: Secondary | ICD-10-CM | POA: Diagnosis not present

## 2021-01-24 DIAGNOSIS — F331 Major depressive disorder, recurrent, moderate: Secondary | ICD-10-CM | POA: Diagnosis not present

## 2021-02-07 DIAGNOSIS — F331 Major depressive disorder, recurrent, moderate: Secondary | ICD-10-CM | POA: Diagnosis not present

## 2021-02-21 DIAGNOSIS — F331 Major depressive disorder, recurrent, moderate: Secondary | ICD-10-CM | POA: Diagnosis not present

## 2021-03-05 DIAGNOSIS — S83511D Sprain of anterior cruciate ligament of right knee, subsequent encounter: Secondary | ICD-10-CM | POA: Diagnosis not present

## 2021-03-05 DIAGNOSIS — M6281 Muscle weakness (generalized): Secondary | ICD-10-CM | POA: Diagnosis not present

## 2021-03-05 DIAGNOSIS — M25661 Stiffness of right knee, not elsewhere classified: Secondary | ICD-10-CM | POA: Diagnosis not present

## 2021-03-06 DIAGNOSIS — M533 Sacrococcygeal disorders, not elsewhere classified: Secondary | ICD-10-CM | POA: Diagnosis not present

## 2021-03-07 DIAGNOSIS — F331 Major depressive disorder, recurrent, moderate: Secondary | ICD-10-CM | POA: Diagnosis not present

## 2021-03-12 DIAGNOSIS — Z20822 Contact with and (suspected) exposure to covid-19: Secondary | ICD-10-CM | POA: Diagnosis not present

## 2021-03-19 DIAGNOSIS — M6281 Muscle weakness (generalized): Secondary | ICD-10-CM | POA: Diagnosis not present

## 2021-03-19 DIAGNOSIS — Z713 Dietary counseling and surveillance: Secondary | ICD-10-CM | POA: Diagnosis not present

## 2021-03-19 DIAGNOSIS — S83511D Sprain of anterior cruciate ligament of right knee, subsequent encounter: Secondary | ICD-10-CM | POA: Diagnosis not present

## 2021-03-19 DIAGNOSIS — Z6841 Body Mass Index (BMI) 40.0 and over, adult: Secondary | ICD-10-CM | POA: Diagnosis not present

## 2021-03-19 DIAGNOSIS — M25661 Stiffness of right knee, not elsewhere classified: Secondary | ICD-10-CM | POA: Diagnosis not present

## 2021-03-21 DIAGNOSIS — S83511D Sprain of anterior cruciate ligament of right knee, subsequent encounter: Secondary | ICD-10-CM | POA: Diagnosis not present

## 2021-03-21 DIAGNOSIS — F331 Major depressive disorder, recurrent, moderate: Secondary | ICD-10-CM | POA: Diagnosis not present

## 2021-03-21 DIAGNOSIS — M25661 Stiffness of right knee, not elsewhere classified: Secondary | ICD-10-CM | POA: Diagnosis not present

## 2021-03-21 DIAGNOSIS — M6281 Muscle weakness (generalized): Secondary | ICD-10-CM | POA: Diagnosis not present

## 2021-04-04 DIAGNOSIS — F331 Major depressive disorder, recurrent, moderate: Secondary | ICD-10-CM | POA: Diagnosis not present

## 2021-04-04 DIAGNOSIS — M6281 Muscle weakness (generalized): Secondary | ICD-10-CM | POA: Diagnosis not present

## 2021-04-04 DIAGNOSIS — S83511D Sprain of anterior cruciate ligament of right knee, subsequent encounter: Secondary | ICD-10-CM | POA: Diagnosis not present

## 2021-04-04 DIAGNOSIS — M25661 Stiffness of right knee, not elsewhere classified: Secondary | ICD-10-CM | POA: Diagnosis not present

## 2021-04-05 DIAGNOSIS — M5416 Radiculopathy, lumbar region: Secondary | ICD-10-CM | POA: Diagnosis not present

## 2021-04-05 DIAGNOSIS — M6281 Muscle weakness (generalized): Secondary | ICD-10-CM | POA: Diagnosis not present

## 2021-04-11 DIAGNOSIS — S83511D Sprain of anterior cruciate ligament of right knee, subsequent encounter: Secondary | ICD-10-CM | POA: Diagnosis not present

## 2021-04-11 DIAGNOSIS — M25661 Stiffness of right knee, not elsewhere classified: Secondary | ICD-10-CM | POA: Diagnosis not present

## 2021-04-11 DIAGNOSIS — M6281 Muscle weakness (generalized): Secondary | ICD-10-CM | POA: Diagnosis not present

## 2021-04-12 DIAGNOSIS — F5101 Primary insomnia: Secondary | ICD-10-CM | POA: Diagnosis not present

## 2021-04-12 DIAGNOSIS — G43909 Migraine, unspecified, not intractable, without status migrainosus: Secondary | ICD-10-CM | POA: Diagnosis not present

## 2021-04-12 DIAGNOSIS — F329 Major depressive disorder, single episode, unspecified: Secondary | ICD-10-CM | POA: Diagnosis not present

## 2021-04-12 DIAGNOSIS — F411 Generalized anxiety disorder: Secondary | ICD-10-CM | POA: Diagnosis not present

## 2021-04-12 DIAGNOSIS — Z23 Encounter for immunization: Secondary | ICD-10-CM | POA: Diagnosis not present

## 2021-04-20 ENCOUNTER — Emergency Department (HOSPITAL_BASED_OUTPATIENT_CLINIC_OR_DEPARTMENT_OTHER)
Admission: EM | Admit: 2021-04-20 | Discharge: 2021-04-20 | Disposition: A | Discharge status: Home / Self Care | Payer: BC Managed Care – PPO | Attending: Emergency Medicine | Admitting: Emergency Medicine

## 2021-04-20 ENCOUNTER — Emergency Department (HOSPITAL_BASED_OUTPATIENT_CLINIC_OR_DEPARTMENT_OTHER): Payer: BC Managed Care – PPO

## 2021-04-20 ENCOUNTER — Encounter (HOSPITAL_BASED_OUTPATIENT_CLINIC_OR_DEPARTMENT_OTHER): Payer: Self-pay

## 2021-04-20 ENCOUNTER — Other Ambulatory Visit: Payer: Self-pay

## 2021-04-20 DIAGNOSIS — R109 Unspecified abdominal pain: Secondary | ICD-10-CM

## 2021-04-20 DIAGNOSIS — R350 Frequency of micturition: Secondary | ICD-10-CM | POA: Insufficient documentation

## 2021-04-20 DIAGNOSIS — R197 Diarrhea, unspecified: Secondary | ICD-10-CM | POA: Insufficient documentation

## 2021-04-20 DIAGNOSIS — K649 Unspecified hemorrhoids: Secondary | ICD-10-CM | POA: Diagnosis not present

## 2021-04-20 DIAGNOSIS — R3915 Urgency of urination: Secondary | ICD-10-CM | POA: Insufficient documentation

## 2021-04-20 DIAGNOSIS — R1030 Lower abdominal pain, unspecified: Secondary | ICD-10-CM | POA: Diagnosis not present

## 2021-04-20 DIAGNOSIS — R1084 Generalized abdominal pain: Secondary | ICD-10-CM | POA: Diagnosis not present

## 2021-04-20 DIAGNOSIS — R112 Nausea with vomiting, unspecified: Secondary | ICD-10-CM | POA: Insufficient documentation

## 2021-04-20 LAB — CBC WITH DIFFERENTIAL/PLATELET
Abs Immature Granulocytes: 0.04 10*3/uL (ref 0.00–0.07)
Basophils Absolute: 0.1 10*3/uL (ref 0.0–0.1)
Basophils Relative: 1 %
Eosinophils Absolute: 0 10*3/uL (ref 0.0–0.5)
Eosinophils Relative: 0 %
HCT: 41.5 % (ref 36.0–46.0)
Hemoglobin: 13.1 g/dL (ref 12.0–15.0)
Immature Granulocytes: 0 %
Lymphocytes Relative: 21 %
Lymphs Abs: 2.1 10*3/uL (ref 0.7–4.0)
MCH: 24.9 pg — ABNORMAL LOW (ref 26.0–34.0)
MCHC: 31.6 g/dL (ref 30.0–36.0)
MCV: 78.7 fL — ABNORMAL LOW (ref 80.0–100.0)
Monocytes Absolute: 0.5 10*3/uL (ref 0.1–1.0)
Monocytes Relative: 4 %
Neutro Abs: 7.6 10*3/uL (ref 1.7–7.7)
Neutrophils Relative %: 74 %
Platelets: 354 10*3/uL (ref 150–400)
RBC: 5.27 MIL/uL — ABNORMAL HIGH (ref 3.87–5.11)
RDW: 16.3 % — ABNORMAL HIGH (ref 11.5–15.5)
WBC: 10.3 10*3/uL (ref 4.0–10.5)
nRBC: 0 % (ref 0.0–0.2)

## 2021-04-20 LAB — URINALYSIS, ROUTINE W REFLEX MICROSCOPIC
Bilirubin Urine: NEGATIVE
Glucose, UA: NEGATIVE mg/dL
Hgb urine dipstick: NEGATIVE
Ketones, ur: NEGATIVE mg/dL
Leukocytes,Ua: NEGATIVE
Nitrite: NEGATIVE
Specific Gravity, Urine: 1.027 (ref 1.005–1.030)
pH: 6 (ref 5.0–8.0)

## 2021-04-20 LAB — BASIC METABOLIC PANEL
Anion gap: 10 (ref 5–15)
BUN: 17 mg/dL (ref 6–20)
CO2: 25 mmol/L (ref 22–32)
Calcium: 9.7 mg/dL (ref 8.9–10.3)
Chloride: 103 mmol/L (ref 98–111)
Creatinine, Ser: 0.61 mg/dL (ref 0.44–1.00)
GFR, Estimated: >60 mL/min (ref >60)
Glucose, Bld: 86 mg/dL (ref 70–99)
Potassium: 3.9 mmol/L (ref 3.5–5.1)
Sodium: 138 mmol/L (ref 135–145)

## 2021-04-20 LAB — HEPATIC FUNCTION PANEL
ALT: 8 U/L (ref 0–44)
AST: 12 U/L — ABNORMAL LOW (ref 15–41)
Albumin: 4.5 g/dL (ref 3.5–5.0)
Alkaline Phosphatase: 63 U/L (ref 38–126)
Bilirubin, Direct: 0.1 mg/dL (ref 0.0–0.2)
Indirect Bilirubin: 0.2 mg/dL — ABNORMAL LOW (ref 0.3–0.9)
Total Bilirubin: 0.3 mg/dL (ref 0.3–1.2)
Total Protein: 8 g/dL (ref 6.5–8.1)

## 2021-04-20 LAB — LIPASE, BLOOD: Lipase: 22 U/L (ref 11–51)

## 2021-04-20 LAB — PREGNANCY, URINE: Preg Test, Ur: NEGATIVE

## 2021-04-20 MED ORDER — GABAPENTIN 300 MG PO CAPS: 300.0000 mg | ORAL_CAPSULE | Freq: Three times a day (TID) | ORAL | 1 refills | Status: DC | PRN

## 2021-04-20 MED ORDER — PROCTOFOAM HC 1-1 % EX FOAM: 1.0000 | Freq: Two times a day (BID) | CUTANEOUS | 1 refills | Status: DC | PRN

## 2021-04-20 MED ORDER — GABAPENTIN 300 MG PO CAPS
300.0000 mg | ORAL_CAPSULE | Freq: Once | ORAL | Status: AC
  Administered 2021-04-20: 300 mg via ORAL
  Filled 2021-04-20: qty 1

## 2021-04-20 MED ORDER — IOHEXOL 350 MG/ML SOLN
75.0000 mL | Freq: Once | INTRAVENOUS | Status: AC | PRN
  Administered 2021-04-20: 75 mL via INTRAVENOUS

## 2021-04-20 MED ORDER — PREPARATION H 0.25-88.44 % RE SUPP: 1.0000 | RECTAL | 0 refills | Status: DC | PRN

## 2021-04-20 MED ORDER — DICYCLOMINE HCL 20 MG PO TABS: 20.0000 mg | ORAL_TABLET | Freq: Three times a day (TID) | ORAL | 1 refills | Status: DC | PRN

## 2021-04-20 MED ORDER — LIDOCAINE HCL URETHRAL/MUCOSAL 2 % EX GEL
1.0000 "application " | Freq: Once | CUTANEOUS | Status: AC
  Administered 2021-04-20: 1 via TOPICAL
  Filled 2021-04-20: qty 11

## 2021-04-20 MED ORDER — DICYCLOMINE HCL 10 MG PO CAPS
20.0000 mg | ORAL_CAPSULE | Freq: Once | ORAL | Status: AC
  Administered 2021-04-20: 20 mg via ORAL
  Filled 2021-04-20: qty 2

## 2021-04-20 NOTE — ED Triage Notes (Signed)
She tells me that she has an impending "weight-loss surgery, and I'm on a special diet that is making my stomach hurt". She cites hx of I.B.S. she is ambulatory and in no distress. IV/labs attempted in triage.

## 2021-04-20 NOTE — ED Provider Notes (Signed)
[facility] EMERGENCY DEPT Provider Note   CSN: [id number] Arrival date & time: 04/20/21  1351     History Chief Complaint  Patient presents with   Abdominal Pain    Regina Wise is a 40 y.o. female present emerged department abdominal pain.  She ports a history of PCOS but has irregular menses.  She reports she has a pending bariatric surgery in [place] scheduled on 05/07/21, and is currently on a bariatric diet, for the past week, which she says is new for her.  She think she is having a flareup of her "IBS".  She describes diffuse cramping abdominal pain that has been constant for 7 days.  Is more severe than I think he has had before.  She describes formed and constant diarrhea, with irritation of her hemorrhoids near her anus.  She reports nausea, several episodes of dry heaving and vomiting.  She also reports some urinary frequency and urgency.  She denies any history of abdominal surgeries.  She reports that she takes Wellbutrin, Bentyl, Prevacid, but these are not relieving her symptoms.  Of note, the patient ports had a colonoscopy at another hospital performed about a year ago, said she had external and internal hemorrhoids, otherwise no abnormalities noted within her colonoscopy.  She denies any prior diagnosis of Crohn's, celiac disease, ulcerative colitis, or other inflammatory bowel disease.  HPI     Past Medical History:  Diagnosis Date   PCOS (polycystic ovarian syndrome)     There are no problems to display for this patient.   Past Surgical History:  Procedure Laterality Date   CERVIX LESION DESTRUCTION     WISDOM TOOTH EXTRACTION       OB History     Gravida  0   Para      Term      Preterm      AB      Living  0      SAB      IAB      Ectopic      Multiple      Live Births              Family History  Problem Relation Age of Onset   COPD Mother    Cancer Maternal Grandfather     Social History   Tobacco Use    Smoking status: Never   Smokeless tobacco: Never  Vaping Use   Vaping Use: Never used  Substance Use Topics   Alcohol use: Yes    Comment: socially   Drug use: No    Home Medications Prior to Admission medications   Medication Sig Start Date End Date Taking? Authorizing Provider  dicyclomine (BENTYL) 20 MG tablet Take 20 mg by mouth every 6 (six) hours.   Yes [provider]  dicyclomine (BENTYL) 20 MG tablet Take 1 tablet (20 mg total) by mouth 3 (three) times daily as needed for up to 60 doses for spasms. 04/20/21  Yes Denita Lun, Kermit Balo, MD  gabapentin (NEURONTIN) 300 MG capsule Take 1 capsule (300 mg total) by mouth 3 (three) times daily as needed for up to 30 doses. 04/20/21  Yes Jazir Newey, Kermit Balo, MD  hydrocortisone-pramoxine (PROCTOFOAM [facility]) rectal foam Place 1 applicator rectally 2 (two) times daily as needed for hemorrhoids or anal itching. Up to 7 days at a time 04/20/21  Yes Yuvin Bussiere, Kermit Balo, MD  shark liver oil-cocoa butter (PREPARATION H) 0.25-88.44 % suppository Place 1 suppository rectally as needed for  up to 24 doses for hemorrhoids. Up to twice daily 04/20/21  Yes Virginio Isidore, Kermit Balo, MD  buPROPion (WELLBUTRIN XL) 300 MG 24 hr tablet Take 300 mg by mouth daily.    [provider]  cephALEXin (KEFLEX) 500 MG capsule Take 1 capsule (500 mg total) by mouth 4 (four) times daily. 06/21/13   Gilda Crease, MD  lansoprazole (PREVACID) 30 MG capsule Take 30 mg by mouth daily at 12 noon.    [provider]  norethindrone-ethinyl estradiol (NECON,BREVICON,MODICON) 0.5-35 MG-MCG tablet Take 1 tablet by mouth daily.    [provider]  phenazopyridine (PYRIDIUM) 200 MG tablet Take 1 tablet (200 mg total) by mouth 3 (three) times daily. 06/21/13   Gilda Crease, MD  Prenatal Vit-Fe Fumarate-FA (PRENATAL MULTIVITAMIN) TABS Take 1 tablet by mouth daily.      [provider]  Sennosides (EX-LAX PO) Take 2 tablets by mouth daily as  needed (constipation).    [provider]  traMADol (ULTRAM) 50 MG tablet Take 1 tablet (50 mg total) by mouth every 6 (six) hours as needed. 10/08/20   Horton, Clabe Seal, DO    Allergies    Pineapple, Codeine, and Sulfa antibiotics  Review of Systems   Review of Systems  Constitutional:  Negative for chills and fever.  HENT:  Negative for ear pain and sore throat.   Eyes:  Negative for pain and visual disturbance.  Respiratory:  Negative for cough and shortness of breath.   Cardiovascular:  Negative for chest pain and palpitations.  Gastrointestinal:  Positive for diarrhea, nausea and vomiting. Negative for abdominal pain.  Genitourinary:  Positive for urgency. Negative for hematuria.  Musculoskeletal:  Negative for arthralgias and myalgias.  Skin:  Negative for color change and rash.  Neurological:  Negative for syncope and light-headedness.  All other systems reviewed and are negative.  Physical Exam Updated Vital Signs BP 112/72 (BP Location: Right Arm)   Pulse 69   Temp 98.3 F (36.8 C) (Oral)   Resp 14   Ht 5\' 4"  (1.626 m)   Wt 120.2 kg   LMP 03/30/2021 (Approximate)   SpO2 100%   BMI 45.49 kg/m   Physical Exam Constitutional:      General: She is not in acute distress.    Appearance: She is obese.  HENT:     Head: Normocephalic and atraumatic.  Eyes:     Conjunctiva/sclera: Conjunctivae normal.     Pupils: Pupils are equal, round, and reactive to light.  Cardiovascular:     Rate and Rhythm: Normal rate and regular rhythm.  Pulmonary:     Effort: Pulmonary effort is normal. No respiratory distress.  Abdominal:     General: There is no distension.     Tenderness: There is abdominal tenderness in the suprapubic area. There is no guarding or rebound.  Skin:    General: Skin is warm and dry.  Neurological:     General: No focal deficit present.     Mental Status: She is alert. Mental status is at baseline.  Psychiatric:        Mood and Affect: Mood  normal.        Behavior: Behavior normal.    ED Results / Procedures / Treatments   Labs (all labs ordered are listed, but only abnormal results are displayed) Labs Reviewed  CBC WITH DIFFERENTIAL/PLATELET - Abnormal; Notable for the following components:      Result Value   RBC 5.27 (*)  MCV 78.7 (*)    MCH 24.9 (*)    RDW 16.3 (*)    All other components within normal limits  HEPATIC FUNCTION PANEL - Abnormal; Notable for the following components:   AST 12 (*)    Indirect Bilirubin 0.2 (*)    All other components within normal limits  URINALYSIS, ROUTINE W REFLEX MICROSCOPIC - Abnormal; Notable for the following components:   Protein, ur TRACE (*)    All other components within normal limits  BASIC METABOLIC PANEL  LIPASE, BLOOD  PREGNANCY, URINE    EKG None  Radiology CT ABDOMEN PELVIS W CONTRAST  Result Date: 04/20/2021 CLINICAL DATA:  Diffuse lower abdominal pain. EXAM: CT ABDOMEN AND PELVIS WITH CONTRAST TECHNIQUE: Multidetector CT imaging of the abdomen and pelvis was performed using the standard protocol following bolus administration of intravenous contrast. CONTRAST:  85mL OMNIPAQUE IOHEXOL 350 MG/ML SOLN COMPARISON:  CT abdomen pelvis dated June 21, 2013. FINDINGS: Lower chest: No acute abnormality. Hepatobiliary: No focal liver abnormality is seen. No gallstones, gallbladder wall thickening, or biliary dilatation. Pancreas: Unremarkable. No pancreatic ductal dilatation or surrounding inflammatory changes. Spleen: Normal in size without focal abnormality. Adrenals/Urinary Tract: Adrenal glands are unremarkable. Kidneys are normal, without renal calculi, focal lesion, or hydronephrosis. Bladder is unremarkable. Stomach/Bowel: Stomach is within normal limits. Appendix appears normal. No evidence of bowel wall thickening, distention, or inflammatory changes. Vascular/Lymphatic: No significant vascular findings are present. No enlarged abdominal or pelvic lymph nodes.  Reproductive: Uterus and bilateral adnexa are unremarkable. Other: No abdominal wall hernia or abnormality. No abdominopelvic ascites. No pneumoperitoneum. Musculoskeletal: No acute or significant osseous findings. IMPRESSION: 1. No acute intra-abdominal process. Electronically Signed   By: Obie Dredge M.D.   On: 04/20/2021 17:46    Procedures Procedures   Medications Ordered in ED Medications  dicyclomine (BENTYL) capsule 20 mg (20 mg Oral Given 04/20/21 1702)  gabapentin (NEURONTIN) capsule 300 mg (300 mg Oral Given 04/20/21 1702)  lidocaine (XYLOCAINE) 2 % jelly 1 application (1 application Topical Given 04/20/21 1702)  iohexol (OMNIPAQUE) 350 MG/ML injection 75 mL (75 mLs Intravenous Contrast Given 04/20/21 1716)    ED Course  I have reviewed the triage vital signs and the nursing notes.  Pertinent labs & imaging results that were available during my care of the patient were reviewed by me and considered in my medical decision making (see chart for details).  This patient presents to the Emergency Department with complaint of abdominal pain. This involves an extensive number of treatment options, and is a complaint that carries with it a high risk of complications and morbidity.  The differential diagnosis includes, but is not limited to, gastritis vs biliary disease vs peptic ulcer vs constipation vs colitis vs UTI vs other  I ordered, reviewed, and interpreted labs, showing no evidence of significant dehydration or acute kidney injury, no anemia, no significant leukocytosis suggest infection.  UA without sign of infection.  Pregnancy negative. I ordered medication for abdominal pain and/or nausea I ordered imaging studies which included CT abdomen pelvis with contrast I independently visualized and interpreted imaging which showed no acute intra-abdominal abnormalities   After the interventions stated above, I reevaluated the patient and found that they remained clinically  stable.  Based on the patient's clinical exam, vital signs, risk factors, and ED testing, I felt that the patient's overall risk of life-threatening emergency such as bowel perforation, surgical emergency, or sepsis was quite low.  I suspect this clinical presentation is most consistent with  IBS cramping in setting of dietary changes, but explained to the patient that this evaluation was not a definitive diagnostic workup.  I discussed outpatient follow up with primary care provider, and provided specialist office number on the patient's discharge paper if a referral was deemed necessary.  I discussed return precautions with the patient. I felt the patient was clinically stable for discharge.   Clinical Course as of 04/20/21 2026  Sat Apr 20, 2021  [id number] She does report some minimal improvement of her pain but still feels that she is sore and cramping.  I will place referral to gastroenterology.  I suspect this may be related to her dietary changes recently.  Did not see evidence of acute colitis or acute surgical inflammatory process on the CT scan.  She has normal white blood cell count.  We can increase her Bentyl dose that she was only taking this once daily.  I also offered some gabapentin as needed.  I also prescribed her some symptomatic management for her hemorrhoids. [MT]    Clinical Course User Index [MT] Renaye Rakers Kermit Balo, MD   Final Clinical Impression(s) / ED Diagnoses Final diagnoses:  Hemorrhoids, unspecified hemorrhoid type  Abdominal cramping    Rx / DC Orders ED Discharge Orders          Ordered    Ambulatory referral to Gastroenterology       Comments: IBS severe abdominal cramping   04/20/21 1842    dicyclomine (BENTYL) 20 MG tablet  3 times daily PRN        04/20/21 1847    gabapentin (NEURONTIN) 300 MG capsule  3 times daily PRN        04/20/21 1847    hydrocortisone-pramoxine (PROCTOFOAM HC) rectal foam  2 times daily PRN        04/20/21 1847    shark liver  oil-cocoa butter (PREPARATION H) 0.25-88.44 % suppository  As needed        04/20/21 1847             Terald Sleeper, MD 04/20/21 2026

## 2021-04-30 DIAGNOSIS — E282 Polycystic ovarian syndrome: Secondary | ICD-10-CM | POA: Diagnosis not present

## 2021-04-30 DIAGNOSIS — Z01818 Encounter for other preprocedural examination: Secondary | ICD-10-CM | POA: Diagnosis not present

## 2021-05-01 DIAGNOSIS — Z9889 Other specified postprocedural states: Secondary | ICD-10-CM | POA: Diagnosis not present

## 2021-05-02 DIAGNOSIS — F331 Major depressive disorder, recurrent, moderate: Secondary | ICD-10-CM | POA: Diagnosis not present

## 2021-05-02 DIAGNOSIS — Z20822 Contact with and (suspected) exposure to covid-19: Secondary | ICD-10-CM | POA: Diagnosis not present

## 2021-05-16 DIAGNOSIS — F331 Major depressive disorder, recurrent, moderate: Secondary | ICD-10-CM | POA: Diagnosis not present

## 2021-08-09 ENCOUNTER — Ambulatory Visit (HOSPITAL_COMMUNITY)
Admission: EM | Admit: 2021-08-09 | Discharge: 2021-08-09 | Disposition: A | Discharge status: Home / Self Care | Payer: 59 | Attending: Behavioral Health | Admitting: Behavioral Health

## 2021-08-09 DIAGNOSIS — R45851 Suicidal ideations: Secondary | ICD-10-CM | POA: Diagnosis not present

## 2021-08-09 DIAGNOSIS — F411 Generalized anxiety disorder: Secondary | ICD-10-CM

## 2021-08-09 MED ORDER — HYDROXYZINE PAMOATE 25 MG PO CAPS: 25.0000 mg | ORAL_CAPSULE | Freq: Three times a day (TID) | ORAL | 0 refills | Status: DC | PRN

## 2021-08-09 NOTE — ED Provider Notes (Signed)
Behavioral Health Urgent Care Medical Screening Exam  Patient Name: Regina Wise MRN: [id number] Date of Evaluation: 08/09/21 Diagnosis:  Final diagnoses:  Suicidal ideation  Generalized anxiety disorder    History of Present illness: Regina Wise is a 41 y.o. female patient who presents voluntarily to the [facility] as a walk-in accompanied by her husband Regina Wise with a chief complaint of "mental crisis."  Patient seen and evaluated face-to-face by this provider with her husband present, chart reviewed and case discussed with Dr. Lucianne Muss. On evaluation, patient is alert and oriented x3. Her thought process is logical and relevant. Her speech is clear and coherent. Her mood is depressed and affect is congruent.  Patient reports that she is having a mental crisis. When asked to elaborate, she reports feeling out of control for the past week. She describes it as feeling angry, anxious and depressed. She describes her symptoms as having dark intrusive thoughts, and cannot watch anything but comedy shows.   She endorses passive suicidal ideations with no plan or intent this week. She reports having intrusive thoughts to overdose on pills, slitting her wrists, or driving her car into a tree. She denies suicidal ideations at this time. She denies a past history of suicide attempts. She denies current self-harm behaviors. She reports a history of cutting as a child. She denies homicidal ideations and reports having episodes of rage. She states that she has a tazer and a razor that she gave to her husband to ensure she does not hurt anyone.  She denies auditory and visual hallucinations. There is no evidence that she is responding to internal or external stimuli. She reports smoking delta 8 daily for the past year. She reports drinking alcohol occasionally, on average once a month.   Patient reports that she is prescribed Zoloft, BuSpar, and Xanax by her PCP. She states that she has not been able to find a  psychiatrist that would take her insurance "[employer]."  She denies a past history of psychiatric inpatient treatment. She reports that [facility] with [facility] in October but stopped after her insurance ended. 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   Patient states that she resides with her husband. She denies access to guns and states that there are no guns in the home. Patient states that she would like to have her medications adjusted. Patient offered to stay here at the [facility] continuous assessment for medication changes. Patient declined. I discussed with the patient following up at the [facility] outpatient on [address] for the partial hospitalization program for therapy and medication management. Patient states that she is interested in the program because she does not like hospitals. A referral sent to [facility] for [facility] I discussed with the patient the risk and benefits of starting Vistaril 25 mg po TID PRN for anxiety. Regina Wise denies any having any safety concerns with the patient returning home.   Safety Plan Regina Wise will reach out to her husband Regina Wise, call 911 or call mobile crisis, or go to nearest emergency room if condition worsens or if suicidal thoughts become active Patient will follow up with the outpatient psychiatric services provided for outpatient treatment including PHP. Suicide risk factors Suicide prevention and interventions National Suicide Hotline telephone number [facility] assessment telephone number [facility] Emergency Assistance 911 [facility] and/or Residential Mobile Crisis Unit telephone number Request made of family/significant other to:  Regina Wise Remove  weapons (e.g., guns, rifles, knives), all items previously/currently identified as safety concern.   Remove drugs/medications (over the counter, prescriptions, illicit drugs), all items previously/currently identified as a safety  concern.    Psychiatric Specialty Exam  Presentation  General Appearance:Appropriate for Environment  Eye Contact:Fair  Speech:Clear and Coherent  Speech Volume:Normal  Handedness:No data recorded  Mood and Affect  Mood:Dysphoric  Affect:Congruent   Thought Process  Thought Processes:Coherent; Goal Directed  Descriptions of Associations:Intact  Orientation:Full (Time, Place and Person)  Thought Content:Logical    Hallucinations:None  Ideas of Reference:None  Suicidal Thoughts:Yes, Passive  Homicidal Thoughts:No   Sensorium  Memory:Immediate Fair; Remote Fair; Recent Fair  Judgment:Fair  Insight:Fair   Executive Functions  Concentration:Fair  Attention Span:Fair  Recall:Fair  Fund of Knowledge:Fair  Language:Fair   Psychomotor Activity  Psychomotor Activity:Normal   Assets  Assets:Desire for Improvement; Manufacturing systems engineer; Social Support; Health and safety inspector; Talents/Skills; Investment banker, corporate; Housing; Intimacy; Leisure Time; Physical Health   Sleep  Sleep:Fair  Number of hours: 8  Physical Exam: Physical Exam Constitutional:      Appearance: Normal appearance.  HENT:     Head: Normocephalic and atraumatic.     Nose: Nose normal.  Eyes:     Conjunctiva/sclera: Conjunctivae normal.  Cardiovascular:     Rate and Rhythm: Normal rate.  Pulmonary:     Effort: Pulmonary effort is normal.  Musculoskeletal:        General: Normal range of motion.     Cervical back: Normal range of motion.  Neurological:     Mental Status: She is alert and oriented to person, place, and time.   Review of Systems  Constitutional: Negative.   HENT: Negative.    Eyes: Negative.   Respiratory: Negative.    Cardiovascular: Negative.   Gastrointestinal: Negative.   Genitourinary: Negative.   Musculoskeletal: Negative.   Skin: Negative.   Neurological: Negative.   Endo/Heme/Allergies: Negative.   Blood pressure  116/76, pulse 73, temperature 98.6 F (37 C), temperature source Oral, resp. rate 18, SpO2 100 %. There is no height or weight on file to calculate BMI.  Musculoskeletal: Strength & Muscle Tone: within normal limits Gait & Station: normal Patient leans: N/A   [facility] MSE Discharge Disposition for Follow up and Recommendations: Based on my evaluation the patient does not appear to have an emergency medical condition and can be discharged with resources and follow up care in outpatient services for Medication Management, Partial Hospitalization Program, Individual Therapy, and Group Therapy   Layla Barter, NP 08/09/2021, 5:51 PM

## 2021-08-09 NOTE — Discharge Instructions (Addendum)
Discharge recommendations:  ?Patient is to take medications as prescribed. ?Please see information for follow-up appointment with psychiatry and therapy. ?Please follow up with your primary care provider for all medical related needs.  ? ?Therapy: We recommend that patient participate in individual therapy to address mental health concerns. ? ?Medications: The parent/guardian is to contact a medical professional and/or outpatient provider to address any new side effects that develop. Parent/guardian should update outpatient providers of any new medications and/or medication changes.  ? ?Safety:  ?The patient should abstain from use of illicit substances/drugs and abuse of any medications. ?If symptoms worsen or do not continue to improve or if the patient becomes actively suicidal or homicidal then it is recommended that the patient return to the closest hospital emergency department, the [facility], or call 911 for further evaluation and treatment. ?National Suicide Prevention Lifeline 1-800-SUICIDE or 1-800-273-8255. ? ?About 988 ?988 offers 24/7 access to trained crisis counselors who can help people experiencing mental health-related distress. People can call or text 988 or chat 988lifeline.org for themselves or if they are worried about a loved one who may need crisis support.  ? ?Please contact one of the following facilities to start medication management and therapy services:  ? ?[facility] ?[address]  ?[place], [place] [address] ?[phone]  ? ?[facility]  ?[address] ?[place], [place] [address] ?[phone] ? ?[facility]  ?[address], [place], [place] [address] ?[phone] ? ?[facility]  ?[address]  ?[place] , [place] [address] ?[phone] ? ?[facility]  ?[address] ?[place], [place] [address] ?[phone] ? ?[facility]  ?[address],  ?[place], [place] [address] ?[phone] ? ?  ?

## 2021-08-09 NOTE — Progress Notes (Signed)
Patient is a 41 year old female that presents voluntary this date with her husband who is present during triage. Patient reports passive S/I and renders limited information in reference to plan/intent. Patient also has concerns in reference to H/I although states she also "just has thoughts" in reference to harming others. Patient states sometimes she feels she "cannot control herself" when in public because "everything just makes her angry." Patient states at times she feels she "wants to do bad things to people" and although denies access to firearms did report that she gave a small knife and stun gun to her husband since she feels the urge to "use it on people at times in public." Patient denies any AVH. 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 Patient states she was diagnosed with MDD/GAD at that time and has been receiving OP services from her PCP who assists with medication management for controlling symptoms. Patient states she is prescribed Zoloft, Buspar and Xanax (Pt has medications in her possession although not with her in triage) and she is unsure of dosages. Patient reports that she has been also involved in counseling at [facility] since 2015 where she meets with a therapist every two weeks although reports she hasn't met with that provider in the last couple months due to insurance issues.  Patient reports using Delta 8 two to three times a week to assist with anxiety. Patient denies any other SA issues. Patient states she would like to have her  medications evaluated/prescribed by a mental health provider and also become involved in counseling again.

## 2021-08-09 NOTE — ED Notes (Signed)
Patient received after visit summary which contains follow up instructions [facility]. Patient also received paper prescription for vistaril. All belongings from [facility] locker given to patient. Patient denied SI, HI and AVH at time of discharge. Patient left urgent car in personal vehicle.

## 2021-08-12 ENCOUNTER — Telehealth (HOSPITAL_COMMUNITY): Payer: Self-pay | Admitting: Licensed Clinical Social Worker

## 2021-08-12 NOTE — Telephone Encounter (Signed)
Cln returned call and oriented to PHP. Husband reports pt is not able to engage in group at this time and is scheduled for outpatient therapy with Darren in office on Thursday. Cln provided instructions for securing a psychiatric provider if needed by walk-in hours at [facility] and calling number on insurance card. Mr. Sylvia verbalized understanding and expressed appreciation.

## 2021-08-15 ENCOUNTER — Ambulatory Visit (INDEPENDENT_AMBULATORY_CARE_PROVIDER_SITE_OTHER): Payer: 59 | Admitting: Clinical

## 2021-08-15 ENCOUNTER — Other Ambulatory Visit: Payer: Self-pay

## 2021-08-15 DIAGNOSIS — F39 Unspecified mood [affective] disorder: Secondary | ICD-10-CM | POA: Diagnosis not present

## 2021-08-15 DIAGNOSIS — Z87828 Personal history of other (healed) physical injury and trauma: Secondary | ICD-10-CM | POA: Diagnosis not present

## 2021-08-15 DIAGNOSIS — F419 Anxiety disorder, unspecified: Secondary | ICD-10-CM | POA: Diagnosis not present

## 2021-08-15 DIAGNOSIS — F603 Borderline personality disorder: Secondary | ICD-10-CM | POA: Diagnosis not present
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
## 2021-08-16 ENCOUNTER — Telehealth (HOSPITAL_COMMUNITY): Payer: Self-pay | Admitting: Clinical

## 2021-08-16 NOTE — BH Specialist Note (Deleted)
At pt request, CSW emailed pt list of clincians who specialize in DBT to treat BPD. Referral for DBT clinician submitted.

## 2021-08-19 ENCOUNTER — Ambulatory Visit (HOSPITAL_COMMUNITY): Payer: BC Managed Care – PPO | Admitting: Clinical

## 2021-08-26 ENCOUNTER — Telehealth (HOSPITAL_COMMUNITY): Payer: Self-pay

## 2021-08-26 NOTE — BH Assessment (Signed)
Care Management - [facility] Follow Up Discharges   Writer attempted to make contact with patient today and was unsuccessful.  Phone just rang, no voicemail.   Per chart review, patient was provided with outpatient resources.  

## 2022-01-08 ENCOUNTER — Ambulatory Visit (HOSPITAL_COMMUNITY)
Admission: EM | Admit: 2022-01-08 | Discharge: 2022-01-09 | Disposition: A | Discharge status: Home / Self Care | Payer: 59 | Attending: Family | Admitting: Family

## 2022-01-08 DIAGNOSIS — F419 Anxiety disorder, unspecified: Secondary | ICD-10-CM | POA: Diagnosis not present

## 2022-01-08 DIAGNOSIS — R45851 Suicidal ideations: Secondary | ICD-10-CM | POA: Insufficient documentation

## 2022-01-08 DIAGNOSIS — Z9152 Personal history of nonsuicidal self-harm: Secondary | ICD-10-CM | POA: Diagnosis not present

## 2022-01-08 DIAGNOSIS — Z20822 Contact with and (suspected) exposure to covid-19: Secondary | ICD-10-CM | POA: Insufficient documentation

## 2022-01-08 DIAGNOSIS — F32A Depression, unspecified: Secondary | ICD-10-CM | POA: Insufficient documentation

## 2022-01-08 DIAGNOSIS — Z79899 Other long term (current) drug therapy: Secondary | ICD-10-CM | POA: Insufficient documentation

## 2022-01-08 DIAGNOSIS — F332 Major depressive disorder, recurrent severe without psychotic features: Secondary | ICD-10-CM | POA: Insufficient documentation

## 2022-01-08 DIAGNOSIS — F1220 Cannabis dependence, uncomplicated: Secondary | ICD-10-CM | POA: Insufficient documentation

## 2022-01-08 LAB — POCT URINE DRUG SCREEN - MANUAL ENTRY (I-SCREEN)
POC Amphetamine UR: NOT DETECTED
POC Buprenorphine (BUP): NOT DETECTED
POC Cocaine UR: NOT DETECTED
POC Marijuana UR: POSITIVE — AB
POC Methadone UR: NOT DETECTED
POC Methamphetamine UR: NOT DETECTED
POC Morphine: NOT DETECTED
POC Oxazepam (BZO): POSITIVE — AB
POC Oxycodone UR: NOT DETECTED
POC Secobarbital (BAR): NOT DETECTED

## 2022-01-08 LAB — LIPID PANEL
Cholesterol: 244 mg/dL — ABNORMAL HIGH (ref 0–200)
HDL: 56 mg/dL (ref >40)
LDL Cholesterol: 171 mg/dL — ABNORMAL HIGH (ref 0–99)
Total CHOL/HDL Ratio: 4.4 RATIO
Triglycerides: 87 mg/dL (ref <150)
VLDL: 17 mg/dL (ref 0–40)

## 2022-01-08 LAB — CBC WITH DIFFERENTIAL/PLATELET
Abs Immature Granulocytes: 0.02 10*3/uL (ref 0.00–0.07)
Basophils Absolute: 0 10*3/uL (ref 0.0–0.1)
Basophils Relative: 0 %
Eosinophils Absolute: 0.1 10*3/uL (ref 0.0–0.5)
Eosinophils Relative: 1 %
HCT: 45 % (ref 36.0–46.0)
Hemoglobin: 14.3 g/dL (ref 12.0–15.0)
Immature Granulocytes: 0 %
Lymphocytes Relative: 26 %
Lymphs Abs: 2.3 10*3/uL (ref 0.7–4.0)
MCH: 27.1 pg (ref 26.0–34.0)
MCHC: 31.8 g/dL (ref 30.0–36.0)
MCV: 85.2 fL (ref 80.0–100.0)
Monocytes Absolute: 0.4 10*3/uL (ref 0.1–1.0)
Monocytes Relative: 5 %
Neutro Abs: 6.3 10*3/uL (ref 1.7–7.7)
Neutrophils Relative %: 68 %
Platelets: 326 10*3/uL (ref 150–400)
RBC: 5.28 MIL/uL — ABNORMAL HIGH (ref 3.87–5.11)
RDW: 14.6 % (ref 11.5–15.5)
WBC: 9.2 10*3/uL (ref 4.0–10.5)
nRBC: 0 % (ref 0.0–0.2)

## 2022-01-08 LAB — COMPREHENSIVE METABOLIC PANEL
ALT: 12 U/L (ref 0–44)
AST: 16 U/L (ref 15–41)
Albumin: 4.1 g/dL (ref 3.5–5.0)
Alkaline Phosphatase: 87 U/L (ref 38–126)
Anion gap: 12 (ref 5–15)
BUN: 10 mg/dL (ref 6–20)
CO2: 26 mmol/L (ref 22–32)
Calcium: 9.8 mg/dL (ref 8.9–10.3)
Chloride: 104 mmol/L (ref 98–111)
Creatinine, Ser: 0.61 mg/dL (ref 0.44–1.00)
GFR, Estimated: >60 mL/min (ref >60)
Glucose, Bld: 81 mg/dL (ref 70–99)
Potassium: 3.5 mmol/L (ref 3.5–5.1)
Sodium: 142 mmol/L (ref 135–145)
Total Bilirubin: 0.4 mg/dL (ref 0.3–1.2)
Total Protein: 7.7 g/dL (ref 6.5–8.1)

## 2022-01-08 LAB — TSH: TSH: 2.08 u[IU]/mL (ref 0.350–4.500)

## 2022-01-08 LAB — POCT PREGNANCY, URINE: Preg Test, Ur: NEGATIVE

## 2022-01-08 LAB — RESP PANEL BY RT-PCR (FLU A&B, COVID) ARPGX2
Influenza A by PCR: NEGATIVE
Influenza B by PCR: NEGATIVE
SARS Coronavirus 2 by RT PCR: NEGATIVE

## 2022-01-08 LAB — ETHANOL: Alcohol, Ethyl (B): <10 mg/dL (ref <10)

## 2022-01-08 LAB — HEMOGLOBIN A1C
Hgb A1c MFr Bld: 5.1 % (ref 4.8–5.6)
Mean Plasma Glucose: 99.67 mg/dL

## 2022-01-08 LAB — POC SARS CORONAVIRUS 2 AG: SARSCOV2ONAVIRUS 2 AG: NEGATIVE

## 2022-01-08 LAB — MAGNESIUM: Magnesium: 2.1 mg/dL (ref 1.7–2.4)

## 2022-01-08 MED ORDER — DICYCLOMINE HCL 20 MG PO TABS: 20.0000 mg | ORAL_TABLET | Freq: Three times a day (TID) | ORAL | Status: DC | PRN

## 2022-01-08 MED ORDER — PANTOPRAZOLE SODIUM 20 MG PO TBEC: 20.0000 mg | DELAYED_RELEASE_TABLET | Freq: Every day | ORAL | Status: DC

## 2022-01-08 MED ORDER — BUSPIRONE HCL 15 MG PO TABS
7.5000 mg | ORAL_TABLET | Freq: Two times a day (BID) | ORAL | Status: DC
  Filled 2022-01-08: qty 1

## 2022-01-08 MED ORDER — ALPRAZOLAM 0.5 MG PO TABS
1.0000 mg | ORAL_TABLET | Freq: Every evening | ORAL | Status: DC | PRN
  Administered 2022-01-08: 1 mg via ORAL
  Filled 2022-01-08: qty 2

## 2022-01-08 MED ORDER — HYDROXYZINE HCL 25 MG PO TABS: 25.0000 mg | ORAL_TABLET | Freq: Three times a day (TID) | ORAL | Status: DC | PRN

## 2022-01-08 MED ORDER — TRAZODONE HCL 50 MG PO TABS
50.0000 mg | ORAL_TABLET | Freq: Every evening | ORAL | Status: DC | PRN
  Administered 2022-01-08: 50 mg via ORAL
  Filled 2022-01-08: qty 1

## 2022-01-08 MED ORDER — MAGNESIUM HYDROXIDE 400 MG/5ML PO SUSP: 30.0000 mL | Freq: Every day | ORAL | Status: DC | PRN

## 2022-01-08 MED ORDER — ALUM & MAG HYDROXIDE-SIMETH 200-200-20 MG/5ML PO SUSP: 30.0000 mL | ORAL | Status: DC | PRN

## 2022-01-08 MED ORDER — ACETAMINOPHEN 325 MG PO TABS: 650.0000 mg | ORAL_TABLET | Freq: Four times a day (QID) | ORAL | Status: DC | PRN

## 2022-01-08 NOTE — ED Provider Notes (Signed)
[facility] Continuous Assessment Admission H&P  Date: 01/08/22 Patient Name: Regina Wise MRN: [id number] Chief Complaint:  Chief Complaint  Patient presents with   Depression   Suicidal      Diagnoses:  Final diagnoses:  Suicidal ideation    HPI: Patient presents voluntarily to [facility] behavioral health for walk-in assessment.    She endorses passive suicidal ideation, reports plan would be "do what ever is handy and available."  She denies intent to complete suicide at this time.  She reports "what I have to do to get some help, do I have to slit my wrists?" She is unable to contract verbally for safety with this Clinical research associate.   Recent stressors include her aunt who has been ill for some time, passed away today.  Her aunt lives in [place], where patient is from, she reports she has not been able to return home in 3 years related to her dysfunctional family she has distanced herself.  Chronic stressors include a strained marriage and challenges with employment.  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  Regina Wise reports she would like to be admitted to an inpatient psychiatric facility as she "wants to get a proper diagnosis and get the right medications."  She reports she has been diagnosed with anxiety and depression but feels she may be more appropriately diagnosed with bipolar disorder.  She has had difficulty in making outpatient follow-up appointments related to her insurance and long wait times prior to available appointments.  She is followed for outpatient DBT therapy through [facility]  counseling, she is frustrated that she has been assigned four different counselors since early 2023.  She does not have the opportunity to build a relationship with a counselor before counselors are changed.  She is not linked with outpatient psychiatry for medication management.  She reports she is followed by primary care provider who prescribes BuSpar and alprazolam.  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  Medication was discontinued in 2022.  She denies previous inpatient psychiatric hospitalizations.  Family mental health history includes patient's sister who has been diagnosed with bipolar disorder, schizophrenia and opioid use disorder.  Patient's niece has been diagnosed with bipolar disorder.  Patient is assessed face-to-face by nurse practitioner.  She is seated in assessment area, no acute distress.  She is alert and oriented, pleasant and cooperative during assessment.  She presents with depressed mood, tearful affect.  She denies history of suicide attempts.  She denies homicidal ideations. She endorses history of nonsuicidal self-harm behavior, most recent episode of cutting approximately 20 years ago.  She has normal speech and behavior.  She denies auditory and visual hallucinations.  Patient is able to converse coherently with goal-directed thoughts and no distractibility or preoccupation.  She denies paranoia.  Objectively there is no evidence of psychosis/mania or delusional thinking.  [name] resides in [place] with her husband, she denies access to weapons.  She is seeking employment.  Patient endorses average sleep and appetite.  She endorses daily marijuana use, she denies alcohol use,  she denies substance use aside from marijuana.  Reports a recent increase in marijuana use as she uses marijuana to "check out." She endorses average sleep and appetite.   Patient offered support and  encouragement.   PHQ 2-9:  Flowsheet Row ED from 01/08/2022 in [facility] Counselor from 08/15/2021 in BEHAVIORAL HEALTH OUTPATIENT THERAPY [place]  Thoughts that you would be better off dead, or of hurting yourself in some way More than half the days Several days  PHQ-9 Total Score 22 18       Flowsheet Row ED from 01/08/2022 in [facility] Counselor from 08/15/2021 in BEHAVIORAL HEALTH OUTPATIENT THERAPY  ED from 04/20/2021 in [facility]  C-SSRS RISK CATEGORY Moderate Risk No Risk No Risk        Total Time spent with patient: 30 minutes  Musculoskeletal  Strength & Muscle Tone: within normal limits Gait & Station: normal Patient leans: N/A  Psychiatric Specialty Exam  Presentation General Appearance: Appropriate for Environment; Casual  Eye Contact:Good  Speech:Clear and Coherent; Normal Rate  Speech Volume:Normal  Handedness:Right   Mood and Affect  Mood:Depressed  Affect:Depressed; Tearful   Thought Process  Thought Processes:Coherent; Goal Directed; Linear  Descriptions of Associations:Intact  Orientation:Full (Time, Place and Person)  Thought Content:Logical; WDL    Hallucinations:Hallucinations: None  Ideas of Reference:None  Suicidal Thoughts:Suicidal Thoughts: Yes, Passive SI Passive Intent and/or Plan: Without Intent; Without Plan  Homicidal Thoughts:Homicidal Thoughts: No   Sensorium  Memory:Immediate Good; Recent Good  Judgment:Fair  Insight:Fair   Executive Functions  Concentration:Good  Attention Span:Good  Recall:Good  Fund of Knowledge:Good  Language:Good   Psychomotor Activity  Psychomotor Activity:Psychomotor Activity: Normal   Assets  Assets:Communication Skills; Desire for Improvement; Financial Resources/Insurance; Housing; Leisure Time; Physical Health; Resilience; Intimacy; Social Support   Sleep  Sleep:Sleep:  Fair   Nutritional Assessment (For OBS and FBC admissions only) Has the patient had a weight loss or gain of 10 pounds or more in the last 3 months?: No Has the patient had a decrease in food intake/or appetite?: No Does the patient have dental problems?: No Does the patient have eating habits or behaviors that may be indicators of an eating disorder including binging or inducing vomiting?: No Has the patient recently lost weight without trying?: 0 Has the patient been eating poorly because of a decreased appetite?: 0 Malnutrition Screening Tool Score: 0    Physical Exam Vitals and nursing note reviewed.  Constitutional:      Appearance: Normal appearance. She is well-developed.  HENT:     Head: Normocephalic and atraumatic.     Nose: Nose normal.  Cardiovascular:     Rate and Rhythm: Normal rate.  Pulmonary:     Effort: Pulmonary effort is normal.  Musculoskeletal:        General: Normal range of motion.     Cervical back: Normal range of motion.  Neurological:     Mental Status: She is alert and oriented to person, place, and time.  Psychiatric:        Attention and Perception: Attention and perception normal.        Mood and Affect: Mood is depressed. Affect is tearful.        Speech: Speech normal.        Behavior: Behavior normal. Behavior is cooperative.        Thought Content: Thought content includes suicidal ideation.        Cognition and Memory: Cognition and  memory normal.    Review of Systems  Constitutional: Negative.   HENT: Negative.    Eyes: Negative.   Respiratory: Negative.    Cardiovascular: Negative.   Gastrointestinal: Negative.   Genitourinary: Negative.   Musculoskeletal: Negative.   Skin: Negative.   Neurological: Negative.   Endo/Heme/Allergies: Negative.   Psychiatric/Behavioral:  Positive for depression, substance abuse and suicidal ideas.     Blood pressure 126/90, pulse 66, temperature 98.2 F (36.8 C), temperature source Oral, resp.  rate 18, SpO2 100 %. There is no height or weight on file to calculate BMI.  Past Psychiatric History: Anxiety, depression  Is the patient at risk to self? Yes  Has the patient been a risk to self in the past 6 months? No .    Has the patient been a risk to self within the distant past? No   Is the patient a risk to others? No   Has the patient been a risk to others in the past 6 months? No   Has the patient been a risk to others within the distant past? No   Past Medical History:  Past Medical History:  Diagnosis Date   PCOS (polycystic ovarian syndrome)     Past Surgical History:  Procedure Laterality Date   CERVIX LESION DESTRUCTION     WISDOM TOOTH EXTRACTION      Family History:  Family History  Problem Relation Age of Onset   COPD Mother    Cancer Maternal Grandfather     Social History:  Social History   Socioeconomic History   Marital status: Married    Spouse name: Not on file   Number of children: Not on file   Years of education: Not on file   Highest education level: Not on file  Occupational History   Not on file  Tobacco Use   Smoking status: Never   Smokeless tobacco: Never  Vaping Use   Vaping Use: Never used  Substance and Sexual Activity   Alcohol use: Yes    Comment: socially   Drug use: No   Sexual activity: Yes  Other Topics Concern   Not on file  Social History Narrative   Not on file   Social Determinants of Health   Financial Resource Strain: Not on file  Food Insecurity: Not on file  Transportation Needs: Not on file  Physical Activity: Not on file  Stress: Not on file  Social Connections: Not on file  Intimate Partner Violence: Not on file    SDOH:  SDOH Screenings   Alcohol Screen: Not on file  Depression (PHQ2-9): Medium Risk (01/08/2022)   Depression (PHQ2-9)    PHQ-2 Score: 22  Financial Resource Strain: Not on file  Food Insecurity: Not on file  Housing: Not on file  Physical Activity: Not on file  Social  Connections: Not on file  Stress: Not on file  Tobacco Use: Low Risk  (04/20/2021)   Patient History    Smoking Tobacco Use: Never    Smokeless Tobacco Use: Never    Passive Exposure: Not on file  Transportation Needs: Not on file    Last Labs:  Admission on 01/08/2022  Component Date Value Ref Range Status   POC Amphetamine UR 01/08/2022 None Detected  NONE DETECTED (Cut Off Level 1000 ng/mL) Final   POC Secobarbital (BAR) 01/08/2022 None Detected  NONE DETECTED (Cut Off Level 300 ng/mL) Final   POC Buprenorphine (BUP) 01/08/2022 None Detected  NONE DETECTED (Cut Off Level 10 ng/mL) Final  POC Oxazepam (BZO) 01/08/2022 Positive (A)  NONE DETECTED (Cut Off Level 300 ng/mL) Final   POC Cocaine UR 01/08/2022 None Detected  NONE DETECTED (Cut Off Level 300 ng/mL) Final   POC Methamphetamine UR 01/08/2022 None Detected  NONE DETECTED (Cut Off Level 1000 ng/mL) Final   POC Morphine 01/08/2022 None Detected  NONE DETECTED (Cut Off Level 300 ng/mL) Final   POC Methadone UR 01/08/2022 None Detected  NONE DETECTED (Cut Off Level 300 ng/mL) Final   POC Oxycodone UR 01/08/2022 None Detected  NONE DETECTED (Cut Off Level 100 ng/mL) Final   POC Marijuana UR 01/08/2022 Positive (A)  NONE DETECTED (Cut Off Level 50 ng/mL) Final   SARSCOV2ONAVIRUS 2 AG 01/08/2022 NEGATIVE  NEGATIVE Final   Comment: (NOTE) SARS-CoV-2 antigen NOT DETECTED.   Negative results are presumptive.  Negative results do not preclude SARS-CoV-2 infection and should not be used as the sole basis for treatment or other patient management decisions, including infection  control decisions, particularly in the presence of clinical signs and  symptoms consistent with COVID-19, or in those who have been in contact with the virus.  Negative results must be combined with clinical observations, patient history, and epidemiological information. The expected result is Negative.  Fact Sheet for Patients:  https://www.jennings-kim.com/  Fact Sheet for Healthcare Providers: https://alexander-rogers.biz/  This test is not yet approved or cleared by the [place] FDA and  has been authorized for detection and/or diagnosis of SARS-CoV-2 by FDA under an Emergency Use Authorization (EUA).  This EUA will remain in effect (meaning this test can be used) for the duration of  the COV                          ID-19 declaration under Section 564(b)(1) of the Act, 21 U.S.C. section 360bbb-3(b)(1), unless the authorization is terminated or revoked sooner.     Preg Test, Ur 01/08/2022 NEGATIVE  NEGATIVE Final   Comment:        THE SENSITIVITY OF THIS METHODOLOGY IS >24 mIU/mL     Allergies: Pineapple, Codeine, and Sulfa antibiotics  PTA Medications: (Not in a hospital admission)   Medical Decision Making  Patient reviewed with Dr. Nelly Rout.  She will be placed in observation area at [facility] behavioral health while awaiting inpatient psychiatric admission.  She remains voluntary at this time, agrees with treatment plan.  Laboratory studies ordered including CBC, CMP, ethanol, A1c, hepatic function, lipid panel, magnesium and TSH.  Urine pregnancy and urine drug screen ordered.  EKG order initiated.  Current medications: -Acetaminophen 650 mg every 6 as needed/mild pain -Maalox 30 mL oral every 4 as needed/digestion -Hydroxyzine 25 mg 3 times daily as needed/anxiety -Magnesium hydroxide 30 mL daily as needed/mild constipation -Trazodone 50 mg nightly as needed/sleep  Restarted home medications including: Alprazolam 1 mg nightly as needed/anxiety -Buspirone 7.5 mg twice daily -Dicyclomine 20 mg 3 times daily as needed/spasms -Pantoprazole 20 mg daily    Recommendations  Based on my evaluation the patient does not appear to have an emergency medical condition.  Lenard Lance, FNP 01/08/22  7:07 PM

## 2022-01-08 NOTE — ED Notes (Signed)
Pt A&O x 4, presents with suicidal ideations with no plan or intent.  Denies HI or AVH.  Pt calm & cooperative, monitoring for safety.

## 2022-01-08 NOTE — Progress Notes (Signed)
Patient presents to the [facility] with her blue teddy bear and an overnight bag stating that she was here four months ago and was discharged to find and follow-up with an OP Provider and states that she has not been able to get into see anyone and states that her depression is worsening to the point that she is having suicidal thoughts.  When asked how she would kill herself. she states, "I guess I could just overdose on pills." Patient has a history of self-mutilation, but states that she has not cut in a good while.  Patient denies any previous suicide attempts.  She denies HI/Psychosis, but admits to daily marijuana use.  Patient states that she is currently having marital problems and working towards a divorce, but states that she just cannot bring herself to leave because of her depression.  Patient states that she is currently prescribed Zoloft and Buspar, but does not feel like they are working.  Patient states that bipolar disorder runs in her family and feels like she may have bipolar disorder.  States that she feels like she is in a manic episode, not eating or seleeping well.  Patient is urgent.

## 2022-01-08 NOTE — ED Notes (Signed)
Pt A& O x 4, sitting up at bedside at present. No distress noted. Calm & cooperative.  Monitoring for safety.

## 2022-01-08 NOTE — ED Provider Notes (Incomplete)
Facility Based Crisis Admission H&P  Date: 01/08/22 Patient Name: VALEREE LEIDY MRN: [id number] Chief Complaint:  Chief Complaint  Patient presents with   Depression   Suicidal      Diagnoses:  Final diagnoses:  None    HPI: ***  PHQ 2-9:  Flowsheet Row Counselor from 08/15/2021 in BEHAVIORAL HEALTH OUTPATIENT THERAPY [employer]  Thoughts that you would be better off dead, or of hurting yourself in some way Several days  PHQ-9 Total Score 18       Flowsheet Row Counselor from 08/15/2021 in BEHAVIORAL HEALTH OUTPATIENT THERAPY [place] ED from 04/20/2021 in [facility] ED from 10/08/2020 in [facility]  C-SSRS RISK CATEGORY No Risk No Risk No Risk        Total Time spent with patient: {Time; 15 min - 8 hours:17441}  Musculoskeletal  Strength & Muscle Tone: {desc; muscle tone:32375} Gait & Station: {PE GAIT ED BTDV:76160} Patient leans: {Patient Leans:21022755}  Psychiatric Specialty Exam  Presentation General Appearance: Appropriate for Environment; Casual  Eye Contact:Good  Speech:Clear and Coherent; Normal Rate  Speech Volume:Normal  Handedness:Right   Mood and Affect  Mood:Depressed  Affect:Depressed; Tearful   Thought Process  Thought Processes:Coherent; Goal Directed; Linear  Descriptions of Associations:Intact  Orientation:Full (Time, Place and Person)  Thought Content:Logical; WDL    Hallucinations:Hallucinations: None  Ideas of Reference:None  Suicidal Thoughts:Suicidal Thoughts: Yes, Passive SI Passive Intent and/or Plan: Without Intent; Without Plan  Homicidal Thoughts:Homicidal Thoughts: No   Sensorium  Memory:Immediate Good; Recent Good  Judgment:Fair  Insight:Fair   Executive Functions  Concentration:Good  Attention Span:Good  Recall:Good  Fund of Knowledge:Good  Language:Good   Psychomotor Activity  Psychomotor Activity:Psychomotor Activity:  Normal   Assets  Assets:Communication Skills; Desire for Improvement; Financial Resources/Insurance; Housing; Leisure Time; Physical Health; Resilience; Intimacy; Social Support   Sleep  Sleep:Sleep: Fair   Nutritional Assessment (For OBS and FBC admissions only) Has the patient had a weight loss or gain of 10 pounds or more in the last 3 months?: No Has the patient had a decrease in food intake/or appetite?: No Does the patient have dental problems?: No Does the patient have eating habits or behaviors that may be indicators of an eating disorder including binging or inducing vomiting?: No Has the patient recently lost weight without trying?: 0 Has the patient been eating poorly because of a decreased appetite?: 0 Malnutrition Screening Tool Score: 0    Physical Exam ROS  Blood pressure 126/90, pulse 66, temperature 98.2 F (36.8 C), temperature source Oral, resp. rate 18, SpO2 100 %. There is no height or weight on file to calculate BMI.  Past Psychiatric History: ***   Is the patient at risk to self? {YES/NO:21197} Has the patient been a risk to self in the past 6 months? {YES/NO:21197}.    Has the patient been a risk to self within the distant past? {YES/NO:21197}  Is the patient a risk to others? {YES/NO:21197}  Has the patient been a risk to others in the past 6 months? {YES/NO:21197}  Has the patient been a risk to others within the distant past? {YES/NO:21197}  Past Medical History:  Past Medical History:  Diagnosis Date   PCOS (polycystic ovarian syndrome)     Past Surgical History:  Procedure Laterality Date   CERVIX LESION DESTRUCTION     WISDOM TOOTH EXTRACTION      Family History:  Family History  Problem Relation Age of Onset   COPD Mother    Cancer  Maternal Grandfather     Social History:  Social History   Socioeconomic History   Marital status: Married    Spouse name: Not on file   Number of children: Not on file   Years of education: Not  on file   Highest education level: Not on file  Occupational History   Not on file  Tobacco Use   Smoking status: Never   Smokeless tobacco: Never  Vaping Use   Vaping Use: Never used  Substance and Sexual Activity   Alcohol use: Yes    Comment: socially   Drug use: No   Sexual activity: Yes  Other Topics Concern   Not on file  Social History Narrative   Not on file   Social Determinants of Health   Financial Resource Strain: Not on file  Food Insecurity: Not on file  Transportation Needs: Not on file  Physical Activity: Not on file  Stress: Not on file  Social Connections: Not on file  Intimate Partner Violence: Not on file    SDOH:  SDOH Screenings   Alcohol Screen: Not on file  Depression (PHQ2-9): Medium Risk (08/15/2021)   Depression (PHQ2-9)    PHQ-2 Score: 18  Financial Resource Strain: Not on file  Food Insecurity: Not on file  Housing: Not on file  Physical Activity: Not on file  Social Connections: Not on file  Stress: Not on file  Tobacco Use: Low Risk  (04/20/2021)   Patient History    Smoking Tobacco Use: Never    Smokeless Tobacco Use: Never    Passive Exposure: Not on file  Transportation Needs: Not on file    Last Labs:  No visits with results within 6 Month(s) from this visit.  Latest known visit with results is:  Admission on 04/20/2021, Discharged on 04/20/2021  Component Date Value Ref Range Status   Sodium 04/20/2021 138  135 - 145 mmol/L Final   Potassium 04/20/2021 3.9  3.5 - 5.1 mmol/L Final   Chloride 04/20/2021 103  98 - 111 mmol/L Final   CO2 04/20/2021 25  22 - 32 mmol/L Final   Glucose, Bld 04/20/2021 86  70 - 99 mg/dL Final   Glucose reference range applies only to samples taken after fasting for at least 8 hours.   BUN 04/20/2021 17  6 - 20 mg/dL Final   Creatinine, Ser 04/20/2021 0.61  0.44 - 1.00 mg/dL Final   Calcium 73/41/9379 9.7  8.9 - 10.3 mg/dL Final   GFR, Estimated 04/20/2021 >60  >60 mL/min Final   Comment:  (NOTE) Calculated using the CKD-EPI Creatinine Equation (2021)    Anion gap 04/20/2021 10  5 - 15 Final   Performed at [facility], [address] [place], [place] [address]   WBC 04/20/2021 10.3  4.0 - 10.5 K/uL Final   RBC 04/20/2021 5.27 (H)  3.87 - 5.11 MIL/uL Final   Hemoglobin 04/20/2021 13.1  12.0 - 15.0 g/dL Final   HCT 73/53/2992 41.5  36.0 - 46.0 % Final   MCV 04/20/2021 78.7 (L)  80.0 - 100.0 fL Final   MCH 04/20/2021 24.9 (L)  26.0 - 34.0 pg Final   MCHC 04/20/2021 31.6  30.0 - 36.0 g/dL Final   RDW 42/68/3419 16.3 (H)  11.5 - 15.5 % Final   Platelets 04/20/2021 354  150 - 400 K/uL Final   nRBC 04/20/2021 0.0  0.0 - 0.2 % Final   Neutrophils Relative % 04/20/2021 74  % Final   Neutro Abs  04/20/2021 7.6  1.7 - 7.7 K/uL Final   Lymphocytes Relative 04/20/2021 21  % Final   Lymphs Abs 04/20/2021 2.1  0.7 - 4.0 K/uL Final   Monocytes Relative 04/20/2021 4  % Final   Monocytes Absolute 04/20/2021 0.5  0.1 - 1.0 K/uL Final   Eosinophils Relative 04/20/2021 0  % Final   Eosinophils Absolute 04/20/2021 0.0  0.0 - 0.5 K/uL Final   Basophils Relative 04/20/2021 1  % Final   Basophils Absolute 04/20/2021 0.1  0.0 - 0.1 K/uL Final   Immature Granulocytes 04/20/2021 0  % Final   Abs Immature Granulocytes 04/20/2021 0.04  0.00 - 0.07 K/uL Final   Performed at [facility], [address], [place], [place] [address]   Total Protein 04/20/2021 8.0  6.5 - 8.1 g/dL Final   Albumin 69/45/0388 4.5  3.5 - 5.0 g/dL Final   AST 82/80/0349 12 (L)  15 - 41 U/L Final   ALT 04/20/2021 8  0 - 44 U/L Final   Alkaline Phosphatase 04/20/2021 63  38 - 126 U/L Final   Total Bilirubin 04/20/2021 0.3  0.3 - 1.2 mg/dL Final   Bilirubin, Direct 04/20/2021 0.1  0.0 - 0.2 mg/dL Final   Indirect Bilirubin 04/20/2021 0.2 (L)  0.3 - 0.9 mg/dL Final   Performed at [facility], [address], [place], [place] [address]   Lipase 04/20/2021 22  11 -  51 U/L Final   Performed at [facility], [address], [place], [place] [address]   Color, Urine 04/20/2021 YELLOW  YELLOW Final   APPearance 04/20/2021 CLEAR  CLEAR Final   Specific Gravity, Urine 04/20/2021 1.027  1.005 - 1.030 Final   pH 04/20/2021 6.0  5.0 - 8.0 Final   Glucose, UA 04/20/2021 NEGATIVE  NEGATIVE mg/dL Final   Hgb urine dipstick 04/20/2021 NEGATIVE  NEGATIVE Final   Bilirubin Urine 04/20/2021 NEGATIVE  NEGATIVE Final   Ketones, ur 04/20/2021 NEGATIVE  NEGATIVE mg/dL Final   Protein, ur 94/80/1655 TRACE (A)  NEGATIVE mg/dL Final   Nitrite 37/48/2707 NEGATIVE  NEGATIVE Final   Leukocytes,Ua 04/20/2021 NEGATIVE  NEGATIVE Final   Performed at [facility], [address] [place], [place] [address]   Preg Test, Ur 04/20/2021 NEGATIVE  NEGATIVE Final   Comment:        THE SENSITIVITY OF THIS METHODOLOGY IS >20 mIU/mL. Performed at [facility], [address] [place], [place] [address]     Allergies: Pineapple, Codeine, and Sulfa antibiotics  PTA Medications: (Not in a hospital admission)   Long Term Goals: [facility] MD Tx Plan Long Term Goals:30414007::"Improvement in symptoms so as ready for discharge"}  Short Term Goals: {[facility] MD Tx Plan Short Term Goals:27589}  Medical Decision Making  ***    Recommendations  [facility] MSE Recommendations:[id number]}  Lenard Lance, FNP 01/08/22  5:42 PM

## 2022-01-08 NOTE — Discharge Instructions (Addendum)
Outpatient Therapy and Medication Management  [facility] [address] [place], [place] [address] [phone]  [facility] [address] [place], [place] [address] [phone]  [facility] [address] [place], [place] [address] [phone]  [facility] [address] [place], [place] [address] [phone]  Patient is instructed prior to discharge to:  Take all medications as prescribed by his/her mental healthcare provider. Report any adverse effects and or reactions from the medicines to his/her outpatient provider promptly. Keep all scheduled appointments, to ensure that you are getting refills on time and to avoid any interruption in your medication.  If you are unable to keep an appointment call to reschedule.  Be sure to follow-up with resources and follow-up appointments provided.  Patient has been instructed & cautioned: To not engage in alcohol and or illegal drug use while on prescription medicines. In the event of worsening symptoms, patient is instructed to call the crisis hotline, 911 and or go to the nearest ED for appropriate evaluation and treatment of symptoms. To follow-up with his/her primary care provider for your other medical issues, concerns and or health care needs.
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
## 2022-01-08 NOTE — Progress Notes (Signed)
Behavioral Health Coordinator added resources for Regina Wise to follow up with a provider for outpatient therapy and medication management upon discharge.   Nancee Liter [facility]

## 2022-01-09 DIAGNOSIS — R45851 Suicidal ideations: Secondary | ICD-10-CM | POA: Diagnosis not present

## 2022-01-09 MED ORDER — BUSPIRONE HCL 7.5 MG PO TABS: 7.5000 mg | ORAL_TABLET | Freq: Two times a day (BID) | ORAL | 0 refills | Status: DC

## 2022-01-09 NOTE — ED Notes (Signed)
Patient A&O x 4, ambulatory. Patient discharged in no acute distress. Patient denied SI/HI, A/VH upon discharge. Patient verbalized understanding of all discharge instructions explained by staff, to include follow up appointments, RX's and safety plan. Patient reported mood 10/10.  Pt belongings returned to patient from locker #  15 intact. Patient escorted to lobby via staff for transport to destination. Safety maintained.

## 2022-01-09 NOTE — ED Notes (Signed)
Pt sleeping at present, no distress noted.  Monitoring for safety. 

## 2022-01-09 NOTE — ED Notes (Signed)
Pt is awake and alert. She is sitting up in chair, interacting with peer.  Presents with bright , anxious affect. Pt has a smile and is pleasant but also stressed " I need to get out of here and go home".    Pt denies SI, HI or AVH at this time.   Was offered food and something to drink but currently declined.  Awaiting provider for eval.

## 2022-01-10 LAB — PROLACTIN: Prolactin: 12.9 ng/mL (ref 4.8–23.3)

## 2022-01-23 ENCOUNTER — Encounter: Payer: Self-pay | Admitting: Neurology
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
## 2022-06-25 ENCOUNTER — Ambulatory Visit: Payer: BC Managed Care – PPO | Admitting: Neurology

## 2022-09-19 DIAGNOSIS — F411 Generalized anxiety disorder: Secondary | ICD-10-CM | POA: Diagnosis not present

## 2022-09-19 DIAGNOSIS — F4310 Post-traumatic stress disorder, unspecified: Secondary | ICD-10-CM | POA: Diagnosis not present

## 2022-10-17 ENCOUNTER — Ambulatory Visit: Payer: 59 | Admitting: Psychiatry

## 2022-10-21 ENCOUNTER — Encounter: Payer: Self-pay | Admitting: Psychiatry

## 2022-10-21 ENCOUNTER — Ambulatory Visit: Payer: BC Managed Care – PPO | Admitting: Psychiatry

## 2022-10-21 VITALS — BP 115/70 | HR 70 | Ht 64.0 in | Wt 198.8 lb

## 2022-10-21 DIAGNOSIS — G4484 Primary exertional headache: Secondary | ICD-10-CM

## 2022-10-21 DIAGNOSIS — G43019 Migraine without aura, intractable, without status migrainosus: Secondary | ICD-10-CM | POA: Diagnosis not present

## 2022-10-21 MED ORDER — TOPIRAMATE 50 MG PO TABS: 50.0000 mg | ORAL_TABLET | Freq: Every day | ORAL | 6 refills | Status: DC

## 2022-10-21 MED ORDER — UBRELVY 100 MG PO TABS: 100.0000 mg | ORAL_TABLET | ORAL | 6 refills | Status: DC | PRN

## 2022-10-21 NOTE — Progress Notes (Signed)
Referring:  Saintclair Halsted, FNP [place],  [place] [address]  PCP: Saintclair Halsted, [place]  Neurology was asked to evaluate Regina Wise, a 42 year old female for a chief complaint of headaches.  Our recommendations of care will be communicated by shared medical record.    CC:  headaches  History provided from self  HPI:  Medical co-morbidities: asthma, depression, anxiety, s/p gastric bypass  The patient presents for evaluation of headaches which began several years ago. Migraines have been worsening over time. They are always over her right eye. Notes that she had a head trauma on that side when she was younger. Migraines are associated with photophobia, phonophobia, and nausea. She averages 2 migraines per month. They can last up to 2 days at a time.  She takes Maxalt as needed which does help temporarily, but she typically wakes up with the same migraine the next day. It does make her drowsy as well. Takes Topamax 25 mg daily for prevention since 2019 and is not sure if it is helpful. Tolerates this well without issues but is not sure if it's effective.  She has never had head imaging. Saw Neurology last year who deferred imaging at that time but planned to order MRI if headaches continued to worsen.  Headache History: Onset: several years ago Triggers: stress, allergies, menstrual cycle Aura: flashes of light Associated Symptoms:  Photophobia: yes  Phonophobia: yes  Nausea: yes Worse with activity?: yes Duration of headaches: 2 days  Migraine days per month: 4 Headache free days per month: 26  Current Treatment: Abortive Maxalt 5 mg PRN  Preventative Topamax 25 mg daily  Prior Therapies                                 Prevention: Lexapro Lamictal 150 mg daily Topamax 25 mg daily  Rescue: Maxalt 5 mg PRN - drowsiness Imitrex - side effects Phenergan 50 mg PRN NSAIDs contraindicated 2/2 gastric bypass   LABS: CBC    Component Value  Date/Time   WBC 9.2 01/08/2022 1808   RBC 5.28 (H) 01/08/2022 1808   HGB 14.3 01/08/2022 1808   HCT 45.0 01/08/2022 1808   PLT 326 01/08/2022 1808   MCV 85.2 01/08/2022 1808   MCH 27.1 01/08/2022 1808   MCHC 31.8 01/08/2022 1808   RDW 14.6 01/08/2022 1808   LYMPHSABS 2.3 01/08/2022 1808   MONOABS 0.4 01/08/2022 1808   EOSABS 0.1 01/08/2022 1808   BASOSABS 0.0 01/08/2022 1808      Latest Ref Rng & Units 01/08/2022    6:08 PM 04/20/2021    4:40 PM 06/21/2013    4:00 PM  CMP  Glucose 70 - 99 mg/dL 81  86  81   BUN 6 - 20 mg/dL 10  17  8    Creatinine 0.44 - 1.00 mg/dL 0.61  0.61  0.66   Sodium 135 - 145 mmol/L 142  138  136   Potassium 3.5 - 5.1 mmol/L 3.5  3.9  4.0   Chloride 98 - 111 mmol/L 104  103  99   CO2 22 - 32 mmol/L 26  25  24    Calcium 8.9 - 10.3 mg/dL 9.8  9.7  10.0   Total Protein 6.5 - 8.1 g/dL 7.7  8.0  8.7   Total Bilirubin 0.3 - 1.2 mg/dL 0.4  0.3  0.3   Alkaline Phos 38 - 126 U/L  87  63  67   AST 15 - 41 U/L 16  12  22    ALT 0 - 44 U/L 12  8  24       IMAGING:  none  Current Outpatient Medications on File Prior to Visit  Medication Sig Dispense Refill   escitalopram (LEXAPRO) 20 MG tablet Take 20 mg by mouth daily.     rizatriptan (MAXALT) 10 MG tablet Take 10 mg by mouth as needed for migraine. May repeat in 2 hours if needed     busPIRone (BUSPAR) 7.5 MG tablet Take 1 tablet (7.5 mg total) by mouth 2 (two) times daily. 60 tablet 0   dicyclomine (BENTYL) 20 MG tablet Take 1 tablet (20 mg total) by mouth 3 (three) times daily as needed for up to 60 doses for spasms. 60 tablet 1   hydrocortisone-pramoxine (PROCTOFOAM HC) rectal foam Place 1 applicator rectally 2 (two) times daily as needed for hemorrhoids or anal itching. Up to 7 days at a time 10 g 1   lamoTRIgine (LAMICTAL) 150 MG tablet Take 150 mg by mouth daily.     lansoprazole (PREVACID) 30 MG capsule Take 30 mg by mouth daily at 12 noon.     shark liver oil-cocoa butter (PREPARATION H) 0.25-88.44 %  suppository Place 1 suppository rectally as needed for up to 24 doses for hemorrhoids. Up to twice daily 24 suppository 0   No current facility-administered medications on file prior to visit.     Allergies: Allergies  Allergen Reactions   Pineapple    Codeine Itching and Rash   Sulfa Antibiotics Itching and Rash    Family History: Family History  Problem Relation Age of Onset   COPD Mother    Lung cancer Maternal Aunt    Cancer Maternal Grandfather      Past Medical History: Past Medical History:  Diagnosis Date   Endometriosis    PCOS (polycystic ovarian syndrome)     Past Surgical History Past Surgical History:  Procedure Laterality Date   BARIATRIC SURGERY     2022   CERVIX LESION DESTRUCTION     WISDOM TOOTH EXTRACTION      Social History: Social History   Tobacco Use   Smoking status: Never   Smokeless tobacco: Never  Vaping Use   Vaping Use: Never used  Substance Use Topics   Alcohol use: Yes    Comment: socially   Drug use: No    ROS: Negative for fevers, chills. Positive for headaches. All other systems reviewed and negative unless stated otherwise in HPI.   Physical Exam:   Vital Signs: BP 115/70 (BP Location: Left Arm, Patient Position: Sitting, Cuff Size: Normal)   Pulse 70   Ht 5\' 4"  (1.626 m)   Wt 198 lb 12.8 oz (90.2 kg)   BMI 34.12 kg/m  GENERAL: well appearing,in no acute distress,alert SKIN:  Color, texture, turgor normal. No rashes or lesions HEAD:  Normocephalic/atraumatic. CV:  RRR RESP: Normal respiratory effort MSK: no tenderness to palpation over occiput, neck, or shoulders  NEUROLOGICAL: Mental Status: Alert, oriented to person, place and time,Follows commands Cranial Nerves: PERRL, visual fields intact to confrontation, extraocular movements intact, facial sensation intact, no facial droop or ptosis, hearing grossly intact, no dysarthria Motor: muscle strength 5/5 both upper and lower extremities,no drift, normal  tone Reflexes: 2+ throughout Sensation: intact to light touch all 4 extremities Coordination: Finger-to- nose-finger intact bilaterally Gait: normal-based   IMPRESSION: 42 year old female with a history of  asthma, depression, anxiety, s/p gastric bypass who presents for evaluation of migraines. Will order brain MRI as her headaches have been progressively worsening despite treatment. Will increase Topamax to 50 mg daily for prevention. She has failed 2 triptans due to side effects and lack of efficacy. Will start Ubrelvy for rescue.  PLAN: -MRI brain -Prevention: Increase Topamax to 50 mg daily -Rescue: Start Ubrelvy 100 mg PRN   I spent a total of 23 minutes chart reviewing and counseling the patient. Headache education was done. Discussed treatment options including preventive and acute medications. Discussed medication side effects, adverse reactions and drug interactions. Written educational materials and patient instructions outlining all of the above were given.  Follow-up: 6 months   Genia Harold, MD 10/21/2022   9:02 AM

## 2022-10-22 ENCOUNTER — Telehealth: Payer: Self-pay | Admitting: Psychiatry

## 2022-10-22 NOTE — Telephone Encounter (Signed)
Pt scheduled for MR brain w/wo contrast at [facility] for 10/29/22 at [facility] no auth required

## 2022-10-25 DIAGNOSIS — J0100 Acute maxillary sinusitis, unspecified: Secondary | ICD-10-CM | POA: Diagnosis not present

## 2022-10-25 DIAGNOSIS — Z20822 Contact with and (suspected) exposure to covid-19: Secondary | ICD-10-CM | POA: Diagnosis not present

## 2022-10-29 ENCOUNTER — Other Ambulatory Visit: Payer: BC Managed Care – PPO

## 2022-10-29 DIAGNOSIS — J069 Acute upper respiratory infection, unspecified: Secondary | ICD-10-CM | POA: Diagnosis not present

## 2022-10-29 DIAGNOSIS — J329 Chronic sinusitis, unspecified: Secondary | ICD-10-CM | POA: Diagnosis not present

## 2022-10-29 DIAGNOSIS — R6889 Other general symptoms and signs: Secondary | ICD-10-CM | POA: Diagnosis not present

## 2022-11-07 DIAGNOSIS — F411 Generalized anxiety disorder: Secondary | ICD-10-CM | POA: Diagnosis not present

## 2022-11-07 DIAGNOSIS — F4310 Post-traumatic stress disorder, unspecified: Secondary | ICD-10-CM | POA: Diagnosis not present

## 2022-12-09 ENCOUNTER — Other Ambulatory Visit (HOSPITAL_COMMUNITY): Payer: Self-pay

## 2022-12-23 DIAGNOSIS — F411 Generalized anxiety disorder: Secondary | ICD-10-CM | POA: Diagnosis not present

## 2022-12-23 DIAGNOSIS — F4310 Post-traumatic stress disorder, unspecified: Secondary | ICD-10-CM | POA: Diagnosis not present

## 2022-12-26 DIAGNOSIS — F411 Generalized anxiety disorder: Secondary | ICD-10-CM | POA: Diagnosis not present

## 2022-12-26 DIAGNOSIS — F4310 Post-traumatic stress disorder, unspecified: Secondary | ICD-10-CM | POA: Diagnosis not present

## 2023-01-08 DIAGNOSIS — F4310 Post-traumatic stress disorder, unspecified: Secondary | ICD-10-CM | POA: Diagnosis not present

## 2023-01-08 DIAGNOSIS — F411 Generalized anxiety disorder: Secondary | ICD-10-CM | POA: Diagnosis not present

## 2023-02-13 DIAGNOSIS — J329 Chronic sinusitis, unspecified: Secondary | ICD-10-CM | POA: Diagnosis not present

## 2023-02-23 DIAGNOSIS — F411 Generalized anxiety disorder: Secondary | ICD-10-CM | POA: Diagnosis not present

## 2023-02-23 DIAGNOSIS — F4310 Post-traumatic stress disorder, unspecified: Secondary | ICD-10-CM | POA: Diagnosis not present

## 2023-02-27 DIAGNOSIS — J329 Chronic sinusitis, unspecified: Secondary | ICD-10-CM | POA: Diagnosis not present

## 2023-02-27 DIAGNOSIS — J45909 Unspecified asthma, uncomplicated: Secondary | ICD-10-CM | POA: Diagnosis not present

## 2023-02-27 DIAGNOSIS — R0989 Other specified symptoms and signs involving the circulatory and respiratory systems: Secondary | ICD-10-CM | POA: Diagnosis not present

## 2023-02-27 DIAGNOSIS — J069 Acute upper respiratory infection, unspecified: Secondary | ICD-10-CM | POA: Diagnosis not present

## 2023-03-09 DIAGNOSIS — F4310 Post-traumatic stress disorder, unspecified: Secondary | ICD-10-CM | POA: Diagnosis not present

## 2023-03-09 DIAGNOSIS — F411 Generalized anxiety disorder: Secondary | ICD-10-CM | POA: Diagnosis not present

## 2023-03-09 DIAGNOSIS — Z5181 Encounter for therapeutic drug level monitoring: Secondary | ICD-10-CM | POA: Diagnosis not present

## 2023-03-09 DIAGNOSIS — F330 Major depressive disorder, recurrent, mild: Secondary | ICD-10-CM | POA: Diagnosis not present

## 2023-03-18 DIAGNOSIS — F411 Generalized anxiety disorder: Secondary | ICD-10-CM | POA: Diagnosis not present

## 2023-03-18 DIAGNOSIS — F330 Major depressive disorder, recurrent, mild: Secondary | ICD-10-CM | POA: Diagnosis not present

## 2023-03-18 DIAGNOSIS — J329 Chronic sinusitis, unspecified: Secondary | ICD-10-CM | POA: Diagnosis not present

## 2023-03-18 DIAGNOSIS — F4310 Post-traumatic stress disorder, unspecified: Secondary | ICD-10-CM | POA: Diagnosis not present

## 2023-04-07 DIAGNOSIS — Z5181 Encounter for therapeutic drug level monitoring: Secondary | ICD-10-CM | POA: Diagnosis not present

## 2023-04-07 DIAGNOSIS — F411 Generalized anxiety disorder: Secondary | ICD-10-CM | POA: Diagnosis not present

## 2023-04-07 DIAGNOSIS — F330 Major depressive disorder, recurrent, mild: Secondary | ICD-10-CM | POA: Diagnosis not present

## 2023-04-07 DIAGNOSIS — F4310 Post-traumatic stress disorder, unspecified: Secondary | ICD-10-CM | POA: Diagnosis not present

## 2023-04-21 DIAGNOSIS — D225 Melanocytic nevi of trunk: Secondary | ICD-10-CM | POA: Diagnosis not present

## 2023-04-21 DIAGNOSIS — L814 Other melanin hyperpigmentation: Secondary | ICD-10-CM | POA: Diagnosis not present

## 2023-04-21 DIAGNOSIS — L858 Other specified epidermal thickening: Secondary | ICD-10-CM | POA: Diagnosis not present

## 2023-04-21 DIAGNOSIS — L821 Other seborrheic keratosis: Secondary | ICD-10-CM | POA: Diagnosis not present

## 2023-04-28 NOTE — Progress Notes (Unsigned)
Referring:  Camie Patience, FNP [address] [place],  [place] [address]  PCP: Camie Patience, FNP  Neurology was asked to evaluate Regina Wise, a 42 year old female for a chief complaint of headaches.  Our recommendations of care will be communicated by shared medical record.    CC:  headaches  History provided from self  Follow-up visit:  Prior visit: 10/21/2022 with Dr. Delena Bali  Brief HPI:   Regina Wise is a 42 y.o. female with PMH of asthma, depression, anxiety, s/p gastric bypass and migraine headaches who was evaluated by Dr. Delena Bali on 10/21/2022 for progressively worsening migraines, typically occurring 3 times per month lasting up to 2 days associated with photophobia, phonophobia and nausea.  Use of topiramate 25mg  daily since 2019 and Maxalt with limited benefit.  Previously seen by [facility] neurology in 02/2022 who restarted Maxalt for rescue and deferred MRI brain unless migraines worsen.   At prior visit, recommended completion of MRI brain and increase topiramate to 50 mg nightly for prevention and started Ubrelvy for rescue.     Interval history:  Has since stopped Topamax due to no benefit. Typically has 2 migraine days per month which may slightly be hormonal related but can worsen with changes in barometric pressure. Migraines with barometric pressure can last several days at a time. Rizatriptan typically with benefit but causes drowsiness.  Will use Phenergan for nausea but also causes drowsiness. Never started Bernita Raisin, reports insurance denied coverage. MRI brain never completed, she reports changing jobs around that time and completely forgot, does not appear she was called to schedule via epic review.      Headache History: Onset: several years ago Triggers: stress, allergies, menstrual cycle Aura: flashes of light Associated Symptoms:  Photophobia: yes  Phonophobia: yes  Nausea: yes Worse with activity?: yes Duration of headaches:  2 days  Migraine days per month: 2-10  Current Treatment: Abortive Maxalt 5 mg PRN  Preventative None    Prior Therapies                                 Prevention: Lexapro Lamictal 150 mg daily Topamax 50 mg daily -side effects, lack of efficacy Gabapentin - side effects, lack of efficacy   Rescue: Maxalt 5 mg PRN - drowsiness Imitrex - side effects Phenergan 50 mg PRN NSAIDs contraindicated 2/2 gastric bypass   LABS: CBC    Component Value Date/Time   WBC 9.2 01/08/2022 1808   RBC 5.28 (H) 01/08/2022 1808   HGB 14.3 01/08/2022 1808   HCT 45.0 01/08/2022 1808   PLT 326 01/08/2022 1808   MCV 85.2 01/08/2022 1808   MCH 27.1 01/08/2022 1808   MCHC 31.8 01/08/2022 1808   RDW 14.6 01/08/2022 1808   LYMPHSABS 2.3 01/08/2022 1808   MONOABS 0.4 01/08/2022 1808   EOSABS 0.1 01/08/2022 1808   BASOSABS 0.0 01/08/2022 1808      Latest Ref Rng & Units 01/08/2022    6:08 PM 04/20/2021    4:40 PM 06/21/2013    4:00 PM  CMP  Glucose 70 - 99 mg/dL 81  86  81   BUN 6 - 20 mg/dL 10  17  8    Creatinine 0.44 - 1.00 mg/dL 6.04  5.40  9.81   Sodium 135 - 145 mmol/L 142  138  136   Potassium 3.5 - 5.1 mmol/L 3.5  3.9  4.0  Chloride 98 - 111 mmol/L 104  103  99   CO2 22 - 32 mmol/L 26  25  24    Calcium 8.9 - 10.3 mg/dL 9.8  9.7  40.9   Total Protein 6.5 - 8.1 g/dL 7.7  8.0  8.7   Total Bilirubin 0.3 - 1.2 mg/dL 0.4  0.3  0.3   Alkaline Phos 38 - 126 U/L 87  63  67   AST 15 - 41 U/L 16  12  22    ALT 0 - 44 U/L 12  8  24       IMAGING:  none  Current Outpatient Medications on File Prior to Visit  Medication Sig Dispense Refill   ALPRAZolam (XANAX) 1 MG tablet Take 1 mg by mouth at bedtime as needed.     escitalopram (LEXAPRO) 20 MG tablet Take 20 mg by mouth daily.     lamoTRIgine (LAMICTAL) 150 MG tablet Take 150 mg by mouth daily.     rizatriptan (MAXALT) 10 MG tablet Take 10 mg by mouth as needed for migraine. May repeat in 2 hours if needed     No current  facility-administered medications on file prior to visit.     Allergies: Allergies  Allergen Reactions   Pineapple    Codeine Itching and Rash   Sulfa Antibiotics Itching and Rash    Family History: Family History  Problem Relation Age of Onset   COPD Mother    Lung cancer Maternal Aunt    Cancer Maternal Grandfather      Past Medical History: Past Medical History:  Diagnosis Date   Endometriosis    PCOS (polycystic ovarian syndrome)     Past Surgical History Past Surgical History:  Procedure Laterality Date   BARIATRIC SURGERY     2022   CERVIX LESION DESTRUCTION     WISDOM TOOTH EXTRACTION      Social History: Social History   Tobacco Use   Smoking status: Never   Smokeless tobacco: Never  Vaping Use   Vaping status: Never Used  Substance Use Topics   Alcohol use: Yes    Comment: socially   Drug use: No    ROS: Negative for fevers, chills. Positive for headaches. All other systems reviewed and negative unless stated otherwise in HPI.   Physical Exam:   Vital Signs: BP 115/72 (BP Location: Right Arm, Patient Position: Sitting, Cuff Size: Normal)   Pulse 82   Ht 5\' 4"  (1.626 m)   Wt 198 lb (89.8 kg)   BMI 33.99 kg/m  GENERAL: well appearing,in no acute distress,alert SKIN:  Color, texture, turgor normal. No rashes or lesions HEAD:  Normocephalic/atraumatic. CV:  RRR RESP: Normal respiratory effort MSK: no tenderness to palpation over occiput, neck, or shoulders  NEUROLOGICAL: Mental Status: Alert, oriented to person, place and time,Follows commands Cranial Nerves: PERRL, visual fields intact to confrontation, extraocular movements intact, facial sensation intact, no facial droop or ptosis, hearing grossly intact, no dysarthria Motor: muscle strength 5/5 both upper and lower extremities,no drift, normal tone Reflexes: 2+ throughout Sensation: intact to light touch all 4 extremities Coordination: Finger-to- nose-finger intact  bilaterally Gait: normal-based      IMPRESSION: 42 year old female with a history of asthma, depression, anxiety, s/p gastric bypass who returns for follow-up of migraines progressively worsening. Continues migraines about 2 days/month but can worsen with barometric pressure changes. Discussed use of Nurtec every other day as preventative vs as needed for rescue, she would liek to try as needed  for now and she feels she may struggle to remember every other day dose.     PLAN: -Recommend completion MRI brain as previously ordered -Rescue: Start Nurtec 75mg  daily as needed - advised to call if no benefit, use of Zofran as needed -next steps: consider CRGP, would not recommend antihypertensives due to low BP   Follow-up in 6 months via MyChart video visit or call earlier if needed    I spent 25 minutes of face-to-face and non-face-to-face time with patient.  This included previsit chart review, lab review, study review, order entry, electronic health record documentation, patient education and discussion regarding above diagnoses and treatment plan and answered all other questions to patient's satisfaction  Ihor Austin, [facility]  [facility] [address] [place], [place] [address]  Phone [phone] Fax [phone] Note: This document was prepared with digital dictation and possible smart phrase technology. Any transcriptional errors that result from this process are unintentional.

## 2023-04-29 ENCOUNTER — Ambulatory Visit: Payer: BC Managed Care – PPO | Admitting: Adult Health

## 2023-04-29 ENCOUNTER — Encounter: Payer: Self-pay | Admitting: Adult Health

## 2023-04-29 ENCOUNTER — Other Ambulatory Visit: Payer: Self-pay | Admitting: Adult Health

## 2023-04-29 ENCOUNTER — Other Ambulatory Visit (HOSPITAL_COMMUNITY): Payer: Self-pay

## 2023-04-29 ENCOUNTER — Telehealth: Payer: Self-pay

## 2023-04-29 VITALS — BP 115/72 | HR 82 | Ht 64.0 in | Wt 198.0 lb

## 2023-04-29 DIAGNOSIS — G43119 Migraine with aura, intractable, without status migrainosus: Secondary | ICD-10-CM

## 2023-04-29 MED ORDER — ONDANSETRON 4 MG PO TBDP: 4.0000 mg | ORAL_TABLET | Freq: Three times a day (TID) | ORAL | 11 refills | Status: AC | PRN

## 2023-04-29 MED ORDER — NURTEC 75 MG PO TBDP: 75.0000 mg | ORAL_TABLET | ORAL | 11 refills | Status: DC | PRN

## 2023-04-29 NOTE — Telephone Encounter (Signed)
Pharmacy Patient Advocate Encounter   Received notification from RX Request Messages that prior authorization for Nurtec 75MG  dispersible tablets is required/requested.   Insurance verification completed.   The patient is insured through [facility] .   Per test claim: PA required; PA started via CoverMyMeds. KEY [id number] . Waiting for clinical questions to populate.

## 2023-04-29 NOTE — Patient Instructions (Addendum)
Your Plan:   Recommend starting Nurtec as needed - do not take more than 1 tablet per day  Please let me know if no benefit and we can discuss other options   Use of Zofran as needed  Recommend complete of MRI brain to rule out any vascular reasons for worsening migraines       Follow up in 6 months or call earlier if needed       Thank you for coming to see [place] at [facility]. I hope we have been able to provide you high quality care today.  You may receive a patient satisfaction survey over the next few weeks. We would appreciate your feedback and comments so that we may continue to improve ourselves and the health of our patients.

## 2023-04-30 MED ORDER — UBRELVY 100 MG PO TABS: 100.0000 mg | ORAL_TABLET | ORAL | 5 refills | Status: DC | PRN

## 2023-04-30 NOTE — Telephone Encounter (Signed)
That is fine. It doesn't even look like a PA was previously completed for Ubrelvy when Dr. Delena Bali first ordered it for some reason, this was just from what the patient reported. I am okay with patient trying Bernita Raisin first if that is what insurance is requiring. Thank you!

## 2023-04-30 NOTE — Telephone Encounter (Signed)
   I see the note in the chart for Ubrelvy that the PT never got it-looks like insurance wants PT to try this first unless they have a contraindication to Ubrelvy-please advise-

## 2023-05-01 ENCOUNTER — Telehealth: Payer: Self-pay

## 2023-05-01 ENCOUNTER — Other Ambulatory Visit (HOSPITAL_COMMUNITY): Payer: Self-pay

## 2023-05-01 NOTE — Telephone Encounter (Signed)
Pharmacy Patient Advocate Encounter   Received notification from Physician's Office that prior authorization for Ubrelvy 100MG  tablets is required/requested.   Insurance verification completed.   The patient is insured through [facility] .   Per test claim: PA required; PA started via CoverMyMeds. KEY [id number] . Waiting for clinical questions to populate.

## 2023-05-01 NOTE — Telephone Encounter (Signed)
PA request has been Submitted. New Encounter created for follow up. For additional info see Pharmacy Prior Auth telephone encounter from 05/01/2023.

## 2023-05-06 NOTE — Telephone Encounter (Signed)
Clinical questions have been submitted-awaiting determination. 

## 2023-05-08 ENCOUNTER — Other Ambulatory Visit (HOSPITAL_COMMUNITY): Payer: Self-pay

## 2023-05-08 NOTE — Telephone Encounter (Signed)
Pharmacy Patient Advocate Encounter  Received notification from [facility] that Prior Authorization for Ubrelvy 100MG  tablets has been APPROVED from 05/06/2023 to 07/29/2023. Ran test claim, Copay is $0. This test claim was processed through [facility]- copay amounts may vary at other pharmacies due to pharmacy/plan contracts, or as the patient moves through the different stages of their insurance plan.   PA #/Case ID/Reference #: PA Case ID #: [id number]

## 2023-05-11 DIAGNOSIS — F411 Generalized anxiety disorder: Secondary | ICD-10-CM | POA: Diagnosis not present

## 2023-05-11 DIAGNOSIS — F330 Major depressive disorder, recurrent, mild: Secondary | ICD-10-CM | POA: Diagnosis not present

## 2023-05-12 ENCOUNTER — Ambulatory Visit (INDEPENDENT_AMBULATORY_CARE_PROVIDER_SITE_OTHER): Payer: BC Managed Care – PPO

## 2023-05-12 DIAGNOSIS — G43119 Migraine with aura, intractable, without status migrainosus: Secondary | ICD-10-CM

## 2023-05-12 MED ORDER — GADOBENATE DIMEGLUMINE 529 MG/ML IV SOLN
20.0000 mL | Freq: Once | INTRAVENOUS | Status: AC | PRN
  Administered 2023-05-12: 20 mL via INTRAVENOUS

## 2023-05-28 ENCOUNTER — Other Ambulatory Visit: Payer: Self-pay | Admitting: Adult Health

## 2023-05-28 MED ORDER — NURTEC 75 MG PO TBDP: 75.0000 mg | ORAL_TABLET | ORAL | 11 refills | Status: DC | PRN

## 2023-05-28 NOTE — Telephone Encounter (Signed)
Now that pt officially has tried [place] we can attempt PA for nurtec PA completed on CMM/BCBS KEY: [id number] Waiting on response

## 2023-05-28 NOTE — Telephone Encounter (Signed)
PA approved for nurtec was approved 11/7-1/30/2025  Ref # is [id number]

## 2023-05-28 NOTE — Telephone Encounter (Signed)
Patient called in today and stated her pharmacy never received the prescription for Nurtec. Reviewed chart and looks like pt's insurance would not cover Nurtec without patient trying Bernita Raisin first, I let her know this and that is why the Bernita Raisin was sent in instead, but patient stated she discussed Nurtec with Shanda Bumps and this sounds like a better option for her and is wanting to be on Nurtec. I let her know sometimes insurance has certain stipulations when it comes to medications and sometimes we just have to try out something else before starting another, but she wanted to see if there is another way to get Nurtec for her. Please advise, thank you

## 2023-05-28 NOTE — Addendum Note (Signed)
Addended by: Judi Cong on: 05/28/2023 02:41 PM   Modules accepted: Orders

## 2023-06-03 DIAGNOSIS — J069 Acute upper respiratory infection, unspecified: Secondary | ICD-10-CM | POA: Diagnosis not present

## 2023-06-05 ENCOUNTER — Telehealth: Payer: Self-pay

## 2023-06-05 ENCOUNTER — Other Ambulatory Visit (HOSPITAL_COMMUNITY): Payer: Self-pay

## 2023-06-05 NOTE — Telephone Encounter (Signed)
Pharmacy Patient Advocate Encounter   Received notification from RX Request Messages that prior authorization for Nurtec 75MG  dispersible tablets is required/requested.   Insurance verification completed.   The patient is insured through [facility] .   Per test claim: PA required; PA submitted to above mentioned insurance via CoverMyMeds Key/confirmation #/EOC [id number] Status is pending

## 2023-06-10 ENCOUNTER — Other Ambulatory Visit (HOSPITAL_COMMUNITY): Payer: Self-pay

## 2023-06-10 NOTE — Telephone Encounter (Signed)
No PA needed

## 2023-06-12 DIAGNOSIS — F411 Generalized anxiety disorder: Secondary | ICD-10-CM | POA: Diagnosis not present

## 2023-06-12 DIAGNOSIS — R41840 Attention and concentration deficit: Secondary | ICD-10-CM | POA: Diagnosis not present

## 2023-06-12 DIAGNOSIS — F330 Major depressive disorder, recurrent, mild: Secondary | ICD-10-CM | POA: Diagnosis not present

## 2023-06-15 DIAGNOSIS — Z1331 Encounter for screening for depression: Secondary | ICD-10-CM | POA: Diagnosis not present

## 2023-06-15 DIAGNOSIS — Z1231 Encounter for screening mammogram for malignant neoplasm of breast: Secondary | ICD-10-CM | POA: Diagnosis not present

## 2023-06-15 DIAGNOSIS — Z01419 Encounter for gynecological examination (general) (routine) without abnormal findings: Secondary | ICD-10-CM | POA: Diagnosis not present

## 2023-06-15 DIAGNOSIS — Z124 Encounter for screening for malignant neoplasm of cervix: Secondary | ICD-10-CM | POA: Diagnosis not present

## 2023-07-02 DIAGNOSIS — K219 Gastro-esophageal reflux disease without esophagitis: Secondary | ICD-10-CM | POA: Diagnosis not present

## 2023-07-02 DIAGNOSIS — E538 Deficiency of other specified B group vitamins: Secondary | ICD-10-CM | POA: Diagnosis not present

## 2023-07-02 DIAGNOSIS — Z9884 Bariatric surgery status: Secondary | ICD-10-CM | POA: Diagnosis not present

## 2023-07-02 DIAGNOSIS — Z0000 Encounter for general adult medical examination without abnormal findings: Secondary | ICD-10-CM | POA: Diagnosis not present

## 2023-07-02 DIAGNOSIS — N939 Abnormal uterine and vaginal bleeding, unspecified: Secondary | ICD-10-CM | POA: Diagnosis not present

## 2023-07-02 DIAGNOSIS — E785 Hyperlipidemia, unspecified: Secondary | ICD-10-CM | POA: Diagnosis not present

## 2023-07-02 DIAGNOSIS — E559 Vitamin D deficiency, unspecified: Secondary | ICD-10-CM | POA: Diagnosis not present

## 2023-07-02 DIAGNOSIS — R7309 Other abnormal glucose: Secondary | ICD-10-CM | POA: Diagnosis not present

## 2023-08-27 ENCOUNTER — Other Ambulatory Visit (HOSPITAL_COMMUNITY): Payer: Self-pay

## 2023-09-03 ENCOUNTER — Other Ambulatory Visit (HOSPITAL_COMMUNITY): Payer: Self-pay

## 2023-09-04 ENCOUNTER — Other Ambulatory Visit (HOSPITAL_COMMUNITY): Payer: Self-pay

## 2023-09-13 ENCOUNTER — Other Ambulatory Visit: Payer: Self-pay

## 2023-09-13 ENCOUNTER — Encounter (HOSPITAL_BASED_OUTPATIENT_CLINIC_OR_DEPARTMENT_OTHER): Payer: Self-pay

## 2023-09-13 ENCOUNTER — Emergency Department (HOSPITAL_BASED_OUTPATIENT_CLINIC_OR_DEPARTMENT_OTHER)
Admission: EM | Admit: 2023-09-13 | Discharge: 2023-09-13 | Disposition: A | Discharge status: Home / Self Care | Payer: No Typology Code available for payment source | Attending: Emergency Medicine | Admitting: Emergency Medicine

## 2023-09-13 DIAGNOSIS — R1013 Epigastric pain: Secondary | ICD-10-CM | POA: Insufficient documentation

## 2023-09-13 DIAGNOSIS — R112 Nausea with vomiting, unspecified: Secondary | ICD-10-CM

## 2023-09-13 DIAGNOSIS — G43809 Other migraine, not intractable, without status migrainosus: Secondary | ICD-10-CM | POA: Insufficient documentation

## 2023-09-13 LAB — URINALYSIS, ROUTINE W REFLEX MICROSCOPIC
Bacteria, UA: NONE SEEN
Bilirubin Urine: NEGATIVE
Glucose, UA: NEGATIVE mg/dL
Ketones, ur: NEGATIVE mg/dL
Leukocytes,Ua: NEGATIVE
Nitrite: NEGATIVE
Protein, ur: 30 mg/dL — AB
Specific Gravity, Urine: 1.021 (ref 1.005–1.030)
pH: 6.5 (ref 5.0–8.0)

## 2023-09-13 LAB — COMPREHENSIVE METABOLIC PANEL
ALT: 15 U/L (ref 0–44)
AST: 15 U/L (ref 15–41)
Albumin: 4.8 g/dL (ref 3.5–5.0)
Alkaline Phosphatase: 90 U/L (ref 38–126)
Anion gap: 11 (ref 5–15)
BUN: 16 mg/dL (ref 6–20)
CO2: 28 mmol/L (ref 22–32)
Calcium: 10 mg/dL (ref 8.9–10.3)
Chloride: 101 mmol/L (ref 98–111)
Creatinine, Ser: 0.7 mg/dL (ref 0.44–1.00)
GFR, Estimated: >60 mL/min (ref >60)
Glucose, Bld: 92 mg/dL (ref 70–99)
Potassium: 3.8 mmol/L (ref 3.5–5.1)
Sodium: 140 mmol/L (ref 135–145)
Total Bilirubin: 0.3 mg/dL (ref 0.0–1.2)
Total Protein: 8.1 g/dL (ref 6.5–8.1)

## 2023-09-13 LAB — CBC
HCT: 43.8 % (ref 36.0–46.0)
Hemoglobin: 14.3 g/dL (ref 12.0–15.0)
MCH: 27.6 pg (ref 26.0–34.0)
MCHC: 32.6 g/dL (ref 30.0–36.0)
MCV: 84.4 fL (ref 80.0–100.0)
Platelets: 313 10*3/uL (ref 150–400)
RBC: 5.19 MIL/uL — ABNORMAL HIGH (ref 3.87–5.11)
RDW: 14.6 % (ref 11.5–15.5)
WBC: 8.6 10*3/uL (ref 4.0–10.5)
nRBC: 0 % (ref 0.0–0.2)

## 2023-09-13 LAB — RESP PANEL BY RT-PCR (RSV, FLU A&B, COVID)  RVPGX2
Influenza A by PCR: NEGATIVE
Influenza B by PCR: NEGATIVE
Resp Syncytial Virus by PCR: NEGATIVE
SARS Coronavirus 2 by RT PCR: NEGATIVE

## 2023-09-13 LAB — LIPASE, BLOOD: Lipase: 35 U/L (ref 11–51)

## 2023-09-13 LAB — PREGNANCY, URINE: Preg Test, Ur: NEGATIVE

## 2023-09-13 MED ORDER — KETOROLAC TROMETHAMINE 30 MG/ML IJ SOLN
15.0000 mg | Freq: Once | INTRAMUSCULAR | Status: AC
  Administered 2023-09-13: 15 mg via INTRAVENOUS
  Filled 2023-09-13: qty 1

## 2023-09-13 MED ORDER — METOCLOPRAMIDE HCL 5 MG/ML IJ SOLN
10.0000 mg | Freq: Once | INTRAMUSCULAR | Status: AC
  Administered 2023-09-13: 10 mg via INTRAVENOUS
  Filled 2023-09-13: qty 2

## 2023-09-13 MED ORDER — ONDANSETRON HCL 4 MG/2ML IJ SOLN
4.0000 mg | Freq: Once | INTRAMUSCULAR | Status: AC
  Administered 2023-09-13: 4 mg via INTRAVENOUS
  Filled 2023-09-13: qty 2

## 2023-09-13 MED ORDER — SODIUM CHLORIDE 0.9 % IV BOLUS
1000.0000 mL | Freq: Once | INTRAVENOUS | Status: AC
  Administered 2023-09-13: 1000 mL via INTRAVENOUS

## 2023-09-13 NOTE — ED Triage Notes (Signed)
 Pt c/o "severe abd pain, can't stop throwing up." Onset this AM, advises pain is "everywhere." Hx IBS "really bad, but not like this."  States she took zofran PTA- "threw up immediately after, probably threw up my bentyl too."  Hx migraines, advises "HA that's almost as bad."

## 2023-09-13 NOTE — ED Notes (Signed)
 Patient declined Covid/Flu testing.

## 2023-09-13 NOTE — Discharge Instructions (Signed)
 He was seen in the emergency department for abdominal pain headache nausea and vomiting Your blood work urinalysis and COVID influenza RSV swab all looked okay Your symptoms improved after Toradol Reglan Zofran IV fluids Continue taking all previous prescribed indications Return to the emergency department recurrent symptoms or any other concerns

## 2023-09-13 NOTE — ED Provider Notes (Signed)
 Reg[facility] EMERGENCY DEPARTMENT AT [facility] Provider Note   CSN: [id number] Arrival date & time: 09/13/23  1456     History  Chief Complaint  Patient presents with   Abdominal Pain   Emesis    Regina Wise is a 43 y.o. female.  With a history of IBS, migraine headaches and endometriosis who presents to the ED for nausea vomiting abdominal pain.  Acute onset nausea vomiting epigastric abdominal pain began today.  Also has migraine headache which feels like her usual migraine.  Some recent constipation.  No diarrhea fevers chills chest pain shortness of breath   Abdominal Pain Associated symptoms: vomiting   Emesis Associated symptoms: abdominal pain        Home Medications Prior to Admission medications   Medication Sig Start Date End Date Taking? Authorizing Provider  ALPRAZolam Prudy Feeler) 1 MG tablet Take 1 mg by mouth at bedtime as needed.    [provider]  escitalopram (LEXAPRO) 20 MG tablet Take 20 mg by mouth daily.    [provider]  lamoTRIgine (LAMICTAL) 150 MG tablet Take 150 mg by mouth daily.    [provider]  ondansetron (ZOFRAN-ODT) 4 MG disintegrating tablet Take 1 tablet (4 mg total) by mouth every 8 (eight) hours as needed for nausea or vomiting. 04/29/23   Ihor Austin, NP  Rimegepant Sulfate (NURTEC) 75 MG TBDP Take 1 tablet (75 mg total) by mouth as needed. 05/28/23   Ihor Austin, NP  rizatriptan (MAXALT) 10 MG tablet Take 10 mg by mouth as needed for migraine. May repeat in 2 hours if needed    [provider]      Allergies    Pineapple, Codeine, and Sulfa antibiotics    Review of Systems   Review of Systems  Gastrointestinal:  Positive for abdominal pain and vomiting.    Physical Exam Updated Vital Signs BP 100/78   Pulse 61   Temp 98 F (36.7 C) (Oral)   Resp 18   SpO2 99%  Physical Exam Vitals and nursing note reviewed.  HENT:     Head: Normocephalic and atraumatic.  Eyes:      Pupils: Pupils are equal, round, and reactive to light.  Cardiovascular:     Rate and Rhythm: Normal rate and regular rhythm.  Pulmonary:     Effort: Pulmonary effort is normal.     Breath sounds: Normal breath sounds.  Abdominal:     Palpations: Abdomen is soft.     Tenderness: There is abdominal tenderness in the epigastric area.  Skin:    General: Skin is warm and dry.  Neurological:     Mental Status: She is alert.  Psychiatric:        Mood and Affect: Mood normal.     ED Results / Procedures / Treatments   Labs (all labs ordered are listed, but only abnormal results are displayed) Labs Reviewed  CBC - Abnormal; Notable for the following components:      Result Value   RBC 5.19 (*)    All other components within normal limits  URINALYSIS, ROUTINE W REFLEX MICROSCOPIC - Abnormal; Notable for the following components:   Hgb urine dipstick SMALL (*)    Protein, ur 30 (*)    All other components within normal limits  RESP PANEL BY RT-PCR (RSV, FLU A&B, COVID)  RVPGX2  LIPASE, BLOOD  COMPREHENSIVE METABOLIC PANEL  PREGNANCY, URINE    EKG EKG Interpretation Date/Time:  Sunday September 13 2023 15:10:50 EST  Ventricular Rate:  69 PR Interval:  126 QRS Duration:  78 QT Interval:  404 QTC Calculation: 432 R Axis:   97  Text Interpretation: Normal sinus rhythm Rightward axis Low voltage QRS Nonspecific ST abnormality Abnormal ECG When compared with ECG of 08-Jan-2022 18:25, T wave inversion now evident in Lateral leads Confirmed by Estelle June [phone]) on 09/13/2023 8:14:59 PM  Radiology No results found.  Procedures Procedures    Medications Ordered in ED Medications  sodium chloride 0.9 % bolus 1,000 mL (0 mLs Intravenous Stopped 09/13/23 1842)  ondansetron (ZOFRAN) injection 4 mg (4 mg Intravenous Given 09/13/23 1719)  ketorolac (TORADOL) 30 MG/ML injection 15 mg (15 mg Intravenous Given 09/13/23 1719)  metoCLOPramide (REGLAN) injection 10 mg (10 mg Intravenous  Given 09/13/23 1720)    ED Course/ Medical Decision Making/ A&P Clinical Course as of 09/13/23 2015  Sun Sep 13, 2023  2013 Laboratory workup unremarkable CBC CMP UA show no acute findings pregnancy is negative COVID RSV influenza all negative.  Most likely cause of her symptoms would be IBS versus cannabinoid hyperemesis syndrome with superimposed migraine headache.  Next Reevaluated patient she reports resolution of her symptoms able to eat and drink here without issue.  She will follow-up with her PCP.  Return precaution discussed with her and her father in detail [MP]    Clinical Course User Index [MP] Royanne Foots, DO                                 Medical Decision Making 43 year old female with history as above presenting for acute onset nausea vomiting abdominal pain.  Afebrile and well-appearing here.  Still nauseous.  Epigastric tenderness.    Differential diagnosis includes: IBD flare Cannabinoid hyperemesis syndrome Acute intra-abdominal infectious/inflammatory process such as appendicitis, diverticulitis, pancreatitis and cholecystitis Urinary tract infection Atypical presentation for pneumonia Viral gastroenteritis  Will obtain laboratory workup including CBC with differential, metabolic panel, lipase and urinalysis and pregnancy test  Will provide IV fluids Zofran Toradol and Reglan to treat nausea vomiting abdominal pain headache  Amount and/or Complexity of Data Reviewed Labs: ordered.  Risk Prescription drug management.           Final Clinical Impression(s) / ED Diagnoses Final diagnoses:  Nausea and vomiting, unspecified vomiting type  Other migraine without status migrainosus, not intractable    Rx / DC Orders ED Discharge Orders     None         Royanne Foots, DO 09/13/23 2015

## 2023-09-13 NOTE — ED Notes (Signed)
 Patient tolerating po crackers, drink well.

## 2023-09-17 ENCOUNTER — Encounter (HOSPITAL_BASED_OUTPATIENT_CLINIC_OR_DEPARTMENT_OTHER): Payer: Self-pay

## 2023-09-17 ENCOUNTER — Emergency Department (HOSPITAL_BASED_OUTPATIENT_CLINIC_OR_DEPARTMENT_OTHER)
Admission: EM | Admit: 2023-09-17 | Discharge: 2023-09-17 | Disposition: A | Discharge status: Home / Self Care | Payer: No Typology Code available for payment source | Attending: Emergency Medicine | Admitting: Emergency Medicine

## 2023-09-17 ENCOUNTER — Other Ambulatory Visit: Payer: Self-pay

## 2023-09-17 ENCOUNTER — Emergency Department (HOSPITAL_BASED_OUTPATIENT_CLINIC_OR_DEPARTMENT_OTHER): Payer: No Typology Code available for payment source

## 2023-09-17 DIAGNOSIS — R519 Headache, unspecified: Secondary | ICD-10-CM | POA: Diagnosis present

## 2023-09-17 LAB — CBC WITH DIFFERENTIAL/PLATELET
Abs Immature Granulocytes: 0.02 10*3/uL (ref 0.00–0.07)
Basophils Absolute: 0.1 10*3/uL (ref 0.0–0.1)
Basophils Relative: 1 %
Eosinophils Absolute: 0.2 10*3/uL (ref 0.0–0.5)
Eosinophils Relative: 2 %
HCT: 38.8 % (ref 36.0–46.0)
Hemoglobin: 12.7 g/dL (ref 12.0–15.0)
Immature Granulocytes: 0 %
Lymphocytes Relative: 33 %
Lymphs Abs: 3.2 10*3/uL (ref 0.7–4.0)
MCH: 27.2 pg (ref 26.0–34.0)
MCHC: 32.7 g/dL (ref 30.0–36.0)
MCV: 83.1 fL (ref 80.0–100.0)
Monocytes Absolute: 0.5 10*3/uL (ref 0.1–1.0)
Monocytes Relative: 6 %
Neutro Abs: 5.5 10*3/uL (ref 1.7–7.7)
Neutrophils Relative %: 58 %
Platelets: 293 10*3/uL (ref 150–400)
RBC: 4.67 MIL/uL (ref 3.87–5.11)
RDW: 14.6 % (ref 11.5–15.5)
WBC: 9.5 10*3/uL (ref 4.0–10.5)
nRBC: 0 % (ref 0.0–0.2)

## 2023-09-17 LAB — BASIC METABOLIC PANEL
Anion gap: 12 (ref 5–15)
BUN: 19 mg/dL (ref 6–20)
CO2: 25 mmol/L (ref 22–32)
Calcium: 10 mg/dL (ref 8.9–10.3)
Chloride: 103 mmol/L (ref 98–111)
Creatinine, Ser: 0.81 mg/dL (ref 0.44–1.00)
GFR, Estimated: >60 mL/min (ref >60)
Glucose, Bld: 99 mg/dL (ref 70–99)
Potassium: 3.7 mmol/L (ref 3.5–5.1)
Sodium: 140 mmol/L (ref 135–145)

## 2023-09-17 MED ORDER — KETOROLAC TROMETHAMINE 30 MG/ML IJ SOLN
15.0000 mg | Freq: Once | INTRAMUSCULAR | Status: AC
  Administered 2023-09-17: 15 mg via INTRAVENOUS
  Filled 2023-09-17: qty 1

## 2023-09-17 MED ORDER — DIPHENHYDRAMINE HCL 50 MG/ML IJ SOLN
25.0000 mg | Freq: Once | INTRAMUSCULAR | Status: AC
  Administered 2023-09-17: 25 mg via INTRAVENOUS
  Filled 2023-09-17: qty 1

## 2023-09-17 MED ORDER — DEXAMETHASONE SODIUM PHOSPHATE 10 MG/ML IJ SOLN
10.0000 mg | Freq: Once | INTRAMUSCULAR | Status: AC
  Administered 2023-09-17: 10 mg via INTRAVENOUS
  Filled 2023-09-17: qty 1

## 2023-09-17 MED ORDER — ACETAMINOPHEN 500 MG PO TABS
1000.0000 mg | ORAL_TABLET | Freq: Once | ORAL | Status: AC
  Administered 2023-09-17: 1000 mg via ORAL
  Filled 2023-09-17: qty 2

## 2023-09-17 MED ORDER — IOHEXOL 350 MG/ML SOLN
100.0000 mL | Freq: Once | INTRAVENOUS | Status: AC | PRN
  Administered 2023-09-17: 75 mL via INTRAVENOUS

## 2023-09-17 MED ORDER — METOCLOPRAMIDE HCL 5 MG/ML IJ SOLN
10.0000 mg | Freq: Once | INTRAMUSCULAR | Status: AC
  Administered 2023-09-17: 10 mg via INTRAVENOUS
  Filled 2023-09-17: qty 2

## 2023-09-17 NOTE — ED Triage Notes (Signed)
 Sudden headache beginning tonight after taking Nyquil for nasal congestion.   Pt rates pain 10/10 and is squirming in chair saying Ive never had a headache this bad.

## 2023-09-19 NOTE — ED Provider Notes (Signed)
 [place] EMERGENCY DEPARTMENT AT [facility] Provider Note   CSN: [id number] Arrival date & time: 09/17/23  0049     History  Chief Complaint  Patient presents with   Headache    Regina Wise is a 43 y.o. female.  Patient here with her husband secondary to headache.  Patient has a history of migraines and headaches and is on neurocheck at home.  She tried her home medications they did not seem to help so she presents here for further evaluation.  She states is slightly worse than normal and more bandlike than she is used to.  She states she does have a neurologist.  No fevers, neurologic symptoms or other associated symptoms with it.   Headache      Home Medications Prior to Admission medications   Medication Sig Start Date End Date Taking? Authorizing Provider  ALPRAZolam Prudy Feeler) 1 MG tablet Take 1 mg by mouth at bedtime as needed.    [provider]  escitalopram (LEXAPRO) 20 MG tablet Take 20 mg by mouth daily.    [provider]  lamoTRIgine (LAMICTAL) 150 MG tablet Take 150 mg by mouth daily.    [provider]  ondansetron (ZOFRAN-ODT) 4 MG disintegrating tablet Take 1 tablet (4 mg total) by mouth every 8 (eight) hours as needed for nausea or vomiting. 04/29/23   Ihor Austin, NP  Rimegepant Sulfate (NURTEC) 75 MG TBDP Take 1 tablet (75 mg total) by mouth as needed. 05/28/23   Ihor Austin, NP  rizatriptan (MAXALT) 10 MG tablet Take 10 mg by mouth as needed for migraine. May repeat in 2 hours if needed    [provider]      Allergies    Pineapple, Codeine, and Sulfa antibiotics    Review of Systems   Review of Systems  Neurological:  Positive for headaches.    Physical Exam Updated Vital Signs BP 98/72   Pulse (!) 59   Temp 98.8 F (37.1 C)   Resp 17   Ht 5\' 4"  (1.626 m)   Wt 89.8 kg   SpO2 99%   BMI 33.99 kg/m  Physical Exam Vitals and nursing note reviewed.  Constitutional:      Appearance: She is  well-developed.  HENT:     Head: Normocephalic and atraumatic.  Cardiovascular:     Rate and Rhythm: Normal rate and regular rhythm.  Pulmonary:     Effort: No respiratory distress.     Breath sounds: No stridor.  Abdominal:     General: There is no distension.  Musculoskeletal:     Cervical back: Normal range of motion.  Neurological:     Mental Status: She is alert.     Comments: No altered mental status, able to give full seemingly accurate history.  Face is symmetric, EOM's intact, pupils equal and reactive, vision intact, tongue and uvula midline without deviation. Upper and Lower extremity motor 5/5, intact pain perception in distal extremities, 2+ reflexes in biceps, patella and achilles tendons. Able to perform finger to nose normal with both hands. Walks without assistance or evident ataxia.       ED Results / Procedures / Treatments   Labs (all labs ordered are listed, but only abnormal results are displayed) Labs Reviewed  CBC WITH DIFFERENTIAL/PLATELET  BASIC METABOLIC PANEL    EKG None  Radiology No results found.  Procedures Procedures    Medications Ordered in ED Medications  acetaminophen (TYLENOL) tablet 1,000 mg (1,000 mg Oral Given 09/17/23  0134)  iohexol (OMNIPAQUE) 350 MG/ML injection 100 mL (75 mLs Intravenous Contrast Given 09/17/23 0139)  metoCLOPramide (REGLAN) injection 10 mg (10 mg Intravenous Given 09/17/23 0437)  diphenhydrAMINE (BENADRYL) injection 25 mg (25 mg Intravenous Given 09/17/23 0436)  dexamethasone (DECADRON) injection 10 mg (10 mg Intravenous Given 09/17/23 0436)  ketorolac (TORADOL) 30 MG/ML injection 15 mg (15 mg Intravenous Given 09/17/23 0436)    ED Course/ Medical Decision Making/ A&P                                 Medical Decision Making Amount and/or Complexity of Data Reviewed Labs: ordered. Radiology: ordered.  Risk OTC drugs. Prescription drug management.   CT done 2/2 change in severity and onset. Negative.  HA improved with cocktail here. No neuro symptoms or other concerns for more serious/emergent causes of headache. Stable for d/c with neuro follow up at this time.   Final Clinical Impression(s) / ED Diagnoses Final diagnoses:  Nonintractable headache, unspecified chronicity pattern, unspecified headache type    Rx / DC Orders ED Discharge Orders     None         Hadlei Stitt, Barbara Cower, MD 09/19/23 [phone]

## 2023-09-22 ENCOUNTER — Other Ambulatory Visit: Payer: Self-pay | Admitting: Family Medicine

## 2023-09-22 DIAGNOSIS — R930 Abnormal findings on diagnostic imaging of skull and head, not elsewhere classified: Secondary | ICD-10-CM

## 2023-10-05 ENCOUNTER — Telehealth: Payer: Self-pay | Admitting: Adult Health

## 2023-10-05 NOTE — Telephone Encounter (Signed)
 Pt reports she has recently gone to the ED for her migraines, they are no better.  Pt has asked that she have an in office appointment with Shanda Bumps, NP as it was suggested she f/u with her Neurologist about the migraines.

## 2023-10-05 NOTE — Progress Notes (Unsigned)
 Referring:  Camie Patience, FNP [address] [place],  [place] [address]  PCP: Camie Patience, FNP  Neurology was asked to evaluate Regina Wise, a 43 year old female for a chief complaint of headaches.  Our recommendations of care will be communicated by shared medical record.    CC:  headaches Chief Complaint  Patient presents with   Migraine    RM 3 alone Pt is well, reports she had thunderclap headache on 2/27 and has had a migraine daily since.  She is having quick sharp pain that comes in waves. Associated confusion, dizziness, eye tremor and hard to find words.      History provided from self  Follow-up visit:  Prior visit: 04/29/2023  Brief HPI:   Regina Wise is a 43 y.o. female with PMH of asthma, depression, anxiety, s/p gastric bypass and migraine headaches who was evaluated by Dr. Delena Bali on 10/21/2022 for progressively worsening migraines, typically occurring 3 times per month lasting up to 2 days associated with photophobia, phonophobia and nausea.  Use of topiramate 25mg  daily since 2019 and Maxalt with limited benefit.  Previously seen by [facility] neurology in 02/2022 who restarted Maxalt for rescue and deferred MRI brain unless migraines worsen.   At prior visit, declined interest in preventive therapy, was advised to start Nurtec as needed. Completed MRI brain 04/2023 which was unremarkable.     Interval history:  Patient returns for sooner visit due to worsening headaches. Was seen in the ED on 2/27 for severe migraine without relief from home medications. CTA head noted technically limited imaging due to timing of contrast bolus with prominent venous contamination although noted no acute intracranial abnormalities, negative LVO or hemodynamically significant stenosis, no vascular abnormality and no visible aneurysm.  Headache improved after migraine cocktail. Reports 4-5 additional severe migraines since that time as well as daily milder  migraines which she usually wakes up with, different from her normal migraine. More recent migraines typically stay localized to the frontal region, can occasionally radiate towards occipital.  These migraines are severe and significantly debilitating and come on very quickly.  Feels like her eyes are jittery, eyes roll back in her head, has vertigo, has difficulty speaking, feels her right side is weaker.  Does not lose consciousness, aware throughout migraine. Severe pain usually last 30 to 60 minutes.  Has been having significant photophobia, wearing sunglasses in office today with lights off in exam room. Has been using rizatriptan which has helped some.  Previously using Nurtec which worked better but due to change of insurance, has not been able to get Nurtec.  More severe headaches have been triggered by coughing or straining but not always.  She also mentions having a low-grade fever since February and was recently treated for sinus infection by PCP although symptoms have persisted, she does get chronic sinus infections but current headache feels different than her typical sinus infection headaches.  Denies any changes prior to onset of migraines, no traumatic event, no changes to medications, sleep habits, stress levels or diet.  PCP ordered MRI w/wo contrast which is scheduled to be completed this Friday, 3/21.  Previously completed MRI wo contrast in 04/2023 which was unremarkable.     Current Treatment: Abortive Maxalt 5 mg PRN  Preventative None    Prior Therapies  Prevention: Lexapro Lamictal 150 mg daily Topamax 50 mg daily -side effects, lack of efficacy Gabapentin - side effects, lack of efficacy   Rescue: Maxalt 5 mg PRN - drowsiness Imitrex - side effects Phenergan 50 mg PRN NSAIDs contraindicated 2/2 gastric bypass   LABS: CBC    Component Value Date/Time   WBC 9.5 09/17/2023 0136   RBC 4.67 09/17/2023 0136   HGB 12.7 09/17/2023 0136    HCT 38.8 09/17/2023 0136   PLT 293 09/17/2023 0136   MCV 83.1 09/17/2023 0136   MCH 27.2 09/17/2023 0136   MCHC 32.7 09/17/2023 0136   RDW 14.6 09/17/2023 0136   LYMPHSABS 3.2 09/17/2023 0136   MONOABS 0.5 09/17/2023 0136   EOSABS 0.2 09/17/2023 0136   BASOSABS 0.1 09/17/2023 0136      Latest Ref Rng & Units 09/17/2023    1:36 AM 09/13/2023    3:25 PM 01/08/2022    6:08 PM  CMP  Glucose 70 - 99 mg/dL 99  92  81   BUN 6 - 20 mg/dL 19  16  10    Creatinine 0.44 - 1.00 mg/dL 7.82  9.56  2.13   Sodium 135 - 145 mmol/L 140  140  142   Potassium 3.5 - 5.1 mmol/L 3.7  3.8  3.5   Chloride 98 - 111 mmol/L 103  101  104   CO2 22 - 32 mmol/L 25  28  26    Calcium 8.9 - 10.3 mg/dL 08.6  57.8  9.8   Total Protein 6.5 - 8.1 g/dL  8.1  7.7   Total Bilirubin 0.0 - 1.2 mg/dL  0.3  0.4   Alkaline Phos 38 - 126 U/L  90  87   AST 15 - 41 U/L  15  16   ALT 0 - 44 U/L  15  12      IMAGING:  none  Current Outpatient Medications on File Prior to Visit  Medication Sig Dispense Refill   dicyclomine (BENTYL) 20 MG tablet Take 20 mg by mouth.     escitalopram (LEXAPRO) 20 MG tablet Take 20 mg by mouth daily.     lamoTRIgine (LAMICTAL) 150 MG tablet Take 150 mg by mouth daily.     mirtazapine (REMERON) 15 MG tablet Take 7.5-15 mg by mouth at bedtime as needed.     ondansetron (ZOFRAN-ODT) 4 MG disintegrating tablet Take 1 tablet (4 mg total) by mouth every 8 (eight) hours as needed for nausea or vomiting. 20 tablet 11   ORILISSA 150 MG TABS 150 mg daily.     Rimegepant Sulfate (NURTEC) 75 MG TBDP Take 1 tablet (75 mg total) by mouth as needed. 8 tablet 11   rizatriptan (MAXALT) 10 MG tablet Take 10 mg by mouth as needed for migraine. May repeat in 2 hours if needed     No current facility-administered medications on file prior to visit.     Allergies: Allergies  Allergen Reactions   Pineapple    Codeine Itching and Rash   Sulfa Antibiotics Itching and Rash    Family History: Family  History  Problem Relation Age of Onset   COPD Mother    Lung cancer Maternal Aunt    Cancer Maternal Grandfather      Past Medical History: Past Medical History:  Diagnosis Date   Endometriosis    PCOS (polycystic ovarian syndrome)     Past Surgical History Past Surgical History:  Procedure Laterality Date   BARIATRIC SURGERY  2022   CERVIX LESION DESTRUCTION     WISDOM TOOTH EXTRACTION      Social History: Social History   Tobacco Use   Smoking status: Never   Smokeless tobacco: Never  Vaping Use   Vaping status: Never Used  Substance Use Topics   Alcohol use: Yes    Comment: socially   Drug use: No    ROS: Negative for fevers, chills. Positive for headaches. All other systems reviewed and negative unless stated otherwise in HPI.   Physical Exam:   Vital Signs: BP (!) 141/95   Pulse 76   Ht 5\' 4"  (1.626 m)   Wt 206 lb (93.4 kg)   LMP 08/25/2023   BMI 35.36 kg/m  GENERAL: well appearing,in no acute distress,alert SKIN:  Color, texture, turgor normal. No rashes or lesions HEAD:  Normocephalic/atraumatic. CV:  RRR RESP: Normal respiratory effort MSK: no tenderness to palpation over occiput, neck, or shoulders  NEUROLOGICAL: Mental Status: Alert, oriented to person, place and time,Follows commands Cranial Nerves: PERRL, visual fields intact to confrontation, extraocular movements intact, facial sensation intact, no facial droop or ptosis, hearing grossly intact, no dysarthria Motor: muscle strength 5/5 both upper and lower extremities,no drift, normal tone Reflexes: 2+ throughout Sensation: intact to light touch all 4 extremities Coordination: Finger-to- nose-finger intact bilaterally Gait: normal-based      IMPRESSION: 43 year old female with a history of asthma, depression, anxiety, s/p gastric bypass who returns for follow-up of migraines progressively worsening.  Returns today with severe headaches with onset 09/17/2023, thunderclap type  headache, new features compared to typical migraines.  CTH and CTA 08/2023 largely benign. 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     PLAN:  -Scheduled for MRI brain w/wo contrast 10/09/2023 (ordered by PCP) -Recommend repeating CTA head for reevaluation -consider CT venogram is above normal and symptoms persist -Recommend initiating verapamil 120mg  nightly for prevention, recommend monitoring BP at home -advised to discontinue rizatriptan as this is contraindicated in RCVS, recommend initiating Nurtec for rescue, samples provided today while waiting on approval through insurance      Follow-up will be determined based on the above     I spent 45 minutes of face-to-face and non-face-to-face time with patient.  This included previsit chart review, lab review, study review, order entry, electronic health record documentation, patient education and discussion regarding above diagnoses and treatment plan and answered all other questions to patient's satisfaction  Ihor Austin, [facility]  [facility] [address] [place], [place] [address]  Phone [phone] Fax [phone] Note: This document was prepared with digital dictation and possible smart phrase technology. Any transcriptional errors that result from this process are unintentional.

## 2023-10-06 ENCOUNTER — Encounter: Payer: Self-pay | Admitting: Adult Health

## 2023-10-06 ENCOUNTER — Ambulatory Visit: Admitting: Adult Health

## 2023-10-06 VITALS — BP 141/95 | HR 76 | Ht 64.0 in | Wt 206.0 lb

## 2023-10-06 DIAGNOSIS — G4453 Primary thunderclap headache: Secondary | ICD-10-CM

## 2023-10-06 DIAGNOSIS — G08 Intracranial and intraspinal phlebitis and thrombophlebitis: Secondary | ICD-10-CM | POA: Diagnosis not present

## 2023-10-06 DIAGNOSIS — R519 Headache, unspecified: Secondary | ICD-10-CM

## 2023-10-06 MED ORDER — VERAPAMIL HCL ER 120 MG PO TBCR: 120.0000 mg | EXTENDED_RELEASE_TABLET | Freq: Every day | ORAL | 5 refills | Status: AC

## 2023-10-06 MED ORDER — NURTEC 75 MG PO TBDP
75.0000 mg | ORAL_TABLET | ORAL | 11 refills | Status: AC | PRN
Start: 2023-10-06 — End: ?

## 2023-10-09 ENCOUNTER — Encounter: Payer: Self-pay | Admitting: Adult Health

## 2023-10-12 ENCOUNTER — Telehealth: Payer: Self-pay | Admitting: Adult Health

## 2023-10-12 NOTE — Telephone Encounter (Signed)
 [employer]: [id number] exp. 10/07/23-11/06/23 sent to [facility] [phone]

## 2023-10-14 ENCOUNTER — Telehealth: Payer: Self-pay

## 2023-10-14 ENCOUNTER — Other Ambulatory Visit (HOSPITAL_COMMUNITY): Payer: Self-pay

## 2023-10-14 NOTE — Telephone Encounter (Signed)
 Pharmacy Patient Advocate Encounter   Received notification from Patient Advice Request messages that prior authorization for Nurtec 75MG  dispersible tablets is required/requested.   Insurance verification completed.   The patient is insured through  [employer]  .   Per test claim: PA required; PA submitted to above mentioned insurance via CoverMyMeds Key/confirmation #/EOC [facility] Status is pending

## 2023-10-19 NOTE — Telephone Encounter (Signed)
 Pharmacy Patient Advocate Encounter  Received notification from  [employer]  that Prior Authorization for Nurtec 75MG  dispersible tabletshas been DENIED.  Full denial letter will be uploaded to the media tab. See denial reason below.   PA #/Case ID/Reference #: PA Case ID #: [id number]  Possibly denied due to no documentation on the patient taking Bernita Raisin as far as tried and failed and why it did not work-I only found mentions here and there about Bernita Raisin but no official office notes documenting it.

## 2023-10-20 ENCOUNTER — Telehealth: Payer: Self-pay | Admitting: Adult Health

## 2023-10-20 NOTE — Telephone Encounter (Signed)
 LVM and sent mychart msg informing pt of need to reschedule 11/12/23 appt - NP out

## 2023-10-20 NOTE — Telephone Encounter (Signed)
 I have not yet received MRI report.  Are you able to follow back up on this? Thank you!

## 2023-10-23 ENCOUNTER — Ambulatory Visit (HOSPITAL_BASED_OUTPATIENT_CLINIC_OR_DEPARTMENT_OTHER)
Admission: RE | Admit: 2023-10-23 | Discharge: 2023-10-23 | Disposition: A | Discharge status: Home / Self Care | Source: Ambulatory Visit | Attending: Adult Health | Admitting: Adult Health

## 2023-10-23 DIAGNOSIS — G4453 Primary thunderclap headache: Secondary | ICD-10-CM | POA: Insufficient documentation

## 2023-10-23 DIAGNOSIS — R519 Headache, unspecified: Secondary | ICD-10-CM | POA: Insufficient documentation

## 2023-10-23 MED ORDER — IOHEXOL 350 MG/ML SOLN
75.0000 mL | Freq: Once | INTRAVENOUS | Status: AC | PRN
  Administered 2023-10-23: 75 mL via INTRAVENOUS

## 2023-11-04 ENCOUNTER — Ambulatory Visit: Payer: No Typology Code available for payment source | Admitting: Family Medicine

## 2023-11-12 ENCOUNTER — Telehealth: Payer: BC Managed Care – PPO | Admitting: Adult Health

## 2023-11-17 ENCOUNTER — Encounter: Payer: Self-pay | Admitting: Adult Health

## 2023-11-19 NOTE — Telephone Encounter (Signed)
 Pharmacy Patient Advocate Encounter   Received notification from Physician's Office that prior authorization for Nurtec 75MG  dispersible tablets is required/requested.   Insurance verification completed.   The patient is insured through [facility] .   Per test claim: PA required; PA submitted to above mentioned insurance via CoverMyMeds Key/confirmation #/EOC [id number] Status is pending

## 2023-11-19 NOTE — Telephone Encounter (Signed)
 Insurance will not allow the use of two CGRP's even if one is for abortive and the the other is for preventative.

## 2023-11-20 ENCOUNTER — Other Ambulatory Visit (HOSPITAL_COMMUNITY): Payer: Self-pay

## 2023-11-20 NOTE — Telephone Encounter (Signed)
 Pharmacy Patient Advocate Encounter  Received notification from [facility] that Prior Authorization for Nurtec 75MG  dispersible tablets has been APPROVED from 11/19/2023 to 05/21/2024. Ran test claim, Copay is $0. This test claim was processed through [facility]- copay amounts may vary at other pharmacies due to pharmacy/plan contracts, or as the patient moves through the different stages of their insurance plan.   PA #/Case ID/Reference #: PA Case ID #: [id number]

## 2024-07-25 ENCOUNTER — Encounter: Payer: Self-pay | Admitting: Family Medicine

## 2024-07-25 ENCOUNTER — Ambulatory Visit: Admitting: Family Medicine

## 2024-07-25 VITALS — BP 120/82 | HR 76 | Temp 97.9°F | Ht 64.0 in | Wt 228.0 lb

## 2024-07-25 DIAGNOSIS — E6609 Other obesity due to excess calories: Secondary | ICD-10-CM

## 2024-07-25 DIAGNOSIS — K582 Mixed irritable bowel syndrome: Secondary | ICD-10-CM | POA: Insufficient documentation

## 2024-07-25 DIAGNOSIS — E538 Deficiency of other specified B group vitamins: Secondary | ICD-10-CM

## 2024-07-25 DIAGNOSIS — E66812 Obesity, class 2: Secondary | ICD-10-CM

## 2024-07-25 DIAGNOSIS — Z7689 Persons encountering health services in other specified circumstances: Secondary | ICD-10-CM

## 2024-07-25 DIAGNOSIS — Z6839 Body mass index (BMI) 39.0-39.9, adult: Secondary | ICD-10-CM

## 2024-07-25 DIAGNOSIS — K219 Gastro-esophageal reflux disease without esophagitis: Secondary | ICD-10-CM | POA: Insufficient documentation

## 2024-07-25 MED ORDER — LANSOPRAZOLE 30 MG PO CPDR: 30.0000 mg | DELAYED_RELEASE_CAPSULE | Freq: Every day | ORAL | 3 refills | Status: AC

## 2024-07-25 MED ORDER — DICYCLOMINE HCL 20 MG PO TABS: 20.0000 mg | ORAL_TABLET | Freq: Three times a day (TID) | ORAL | 3 refills | Status: AC

## 2024-07-25 NOTE — Assessment & Plan Note (Signed)
 Stable. Continue Lansoprazole  30mg  daily. Refilled medication.

## 2024-07-25 NOTE — Patient Instructions (Addendum)
-  It was a pleasure to meet you today and look forward to taking care of you. -Continue all medications. Refilled Lansoprazole  and Dicyclomine .  -Ordered labs. Please schedule an appointment with being able to fast. Office will call with lab results and will be available via MyChart.  -Follow up in 1 year for a physical.  3.

## 2024-07-25 NOTE — Assessment & Plan Note (Signed)
 Stable. Continue Dicyclomine  20mg  daily. Refilled medication.

## 2024-07-25 NOTE — Progress Notes (Signed)
 "  New Patient Office Visit  Subjective   Patient ID: Regina Wise, female    DOB: Aug 11, 1980  Age: 44 y.o. MRN: [id number]  CC:  Chief Complaint  Patient presents with   Establish Care    HPI [name] presents to establish care with new provider.  Patients previous primary care provider: [facility] with Anthony Hint, NP. Last seen: earlier 2025.   Specialist: [facility] with Dr. Kate Bellman  [facility] with Harlene Bogaert, NP  [facility] with Saddie Ha, [place]   IBS: Chronic. Patient is taking Dicyclomine  20mg  daily, with sometimes taking another dose if needed. Effective.   GERD: Chronic. Patient is taking Lansoprazole  30mg  daily. Effective.   All other medications are either over the counter supplements or being managed by [facility].  Outpatient Encounter Medications as of 07/25/2024  Medication Sig   buPROPion (WELLBUTRIN XL) 300 MG 24 hr tablet Take 300 mg by mouth every morning.   Cholecalciferol (VITAMIN D-3 PO) Take 1 tablet by mouth daily at 6 (six) AM.   Cyanocobalamin  (VITAMIN B-12 PO) Take 1 tablet by mouth daily.   doxepin (SINEQUAN) 10 MG capsule Take 10-20 mg by mouth at bedtime.   escitalopram (LEXAPRO) 20 MG tablet Take 20 mg by mouth daily.   lamoTRIgine (LAMICTAL) 150 MG tablet Take 150 mg by mouth daily.   MAGNESIUM  CHLORIDE PO Take 1 tablet by mouth daily at 6 (six) AM.   Multiple Vitamins-Minerals (MULTIVITAMIN ADULTS PO) Take 1 tablet by mouth daily.   Omega-3 Fatty Acids (OMEGA 3 FISH OIL PO) Take 1 tablet by mouth daily.   ondansetron  (ZOFRAN -ODT) 4 MG disintegrating tablet Take 1 tablet (4 mg total) by mouth every 8 (eight) hours as needed for nausea or vomiting.   Rimegepant Sulfate (NURTEC) 75 MG TBDP Take 1 tablet (75 mg total) by mouth as needed.   [DISCONTINUED] dicyclomine  (BENTYL ) 20 MG tablet Take 20  mg by mouth.   [DISCONTINUED] lansoprazole  (PREVACID ) 30 MG capsule Take 30 mg by mouth daily at 12 noon.   [DISCONTINUED] ORILISSA 150 MG TABS 150 mg daily.   dicyclomine  (BENTYL ) 20 MG tablet Take 1 tablet (20 mg total) by mouth 4 (four) times daily -  before meals and at bedtime.   lansoprazole  (PREVACID ) 30 MG capsule Take 1 capsule (30 mg total) by mouth daily at 12 noon.   verapamil  (CALAN -SR) 120 MG CR tablet Take 1 tablet (120 mg total) by mouth at bedtime. (Patient not taking: Reported on 07/25/2024)   [DISCONTINUED] mirtazapine (REMERON) 15 MG tablet Take 7.5-15 mg by mouth at bedtime as needed.   No facility-administered encounter medications on file as of 07/25/2024.    Past Medical History:  Diagnosis Date   Allergy    Anxiety    Asthma    Depression    Endometriosis    GERD (gastroesophageal reflux disease)    Heart murmur    IBS (irritable bowel syndrome)    Migraines    PCOS (polycystic ovarian syndrome)     Past Surgical History:  Procedure Laterality Date   ANTERIOR CRUCIATE LIGAMENT REPAIR Right    2021   BARIATRIC SURGERY     2022   CERVIX LESION DESTRUCTION     TRIGGER FINGER RELEASE     Right thumb 2018   WISDOM TOOTH EXTRACTION      Family History  Problem Relation Age of  Onset   COPD Mother    Depression Sister    Heart attack Sister    Heart attack Sister    Lung cancer Maternal Aunt    Cancer Maternal Aunt    Cancer Maternal Grandfather     Social History   Socioeconomic History   Marital status: Married    Spouse name: Not on file   Number of children: 0   Years of education: Not on file   Highest education level: Bachelor's degree (e.g., BA, AB, BS)  Occupational History   Not on file  Tobacco Use   Smoking status: Never   Smokeless tobacco: Never  Vaping Use   Vaping status: Never Used  Substance and Sexual Activity   Alcohol use: Yes    Alcohol/week: 1.0 standard drink of alcohol    Types: 1 Glasses of wine per week     Comment: Im lucky if I drink a whole bottle of wine over the course of a month. If I have a beer I have 2 max.   Drug use: No   Sexual activity: Not Currently    Birth control/protection: None  Other Topics Concern   Not on file  Social History Narrative   Right handed    Wears glasses    Drinks tea (rare) coffee daily   Social Drivers of Health   Tobacco Use: Low Risk (07/25/2024)   Patient History    Smoking Tobacco Use: Never    Smokeless Tobacco Use: Never    Passive Exposure: Not on file  Financial Resource Strain: Low Risk (07/25/2024)   Overall Financial Resource Strain (CARDIA)    Difficulty of Paying Living Expenses: Not hard at all  Food Insecurity: Low Risk (05/23/2024)   Received from [facility]   Epic    Within the past 12 months, you worried that your food would run out before you got money to buy more: Never true    Within the past 12 months, the food you bought just didn't last and you didn't have money to get more. : Never true  Transportation Needs: No Transportation Needs (05/23/2024)   Received from [facility]    In the past 12 months, has lack of reliable transportation kept you from medical appointments, meetings, work or from getting things needed for daily living? : No  Physical Activity: Sufficiently Active (07/25/2024)   Exercise Vital Sign    Days of Exercise per Week: 4 days    Minutes of Exercise per Session: 50 min  Stress: Not on file  Social Connections: Moderately Integrated (07/25/2024)   Social Connection and Isolation Panel    Frequency of Communication with Friends and Family: More than three times a week    Frequency of Social Gatherings with Friends and Family: Three times a week    Attends Religious Services: More than 4 times per year    Active Member of Clubs or Organizations: No    Attends Banker Meetings: Never    Marital Status: Married  Catering Manager Violence: Not At Risk (07/25/2024)   Epic     Fear of Current or Ex-Partner: No    Emotionally Abused: No    Physically Abused: No    Sexually Abused: No  Depression (PHQ2-9): Medium Risk (07/25/2024)   Depression (PHQ2-9)    PHQ-2 Score: 10  Alcohol Screen: Low Risk (07/25/2024)   Alcohol Screen    Last Alcohol Screening Score (AUDIT): 0  Housing: Low Risk (05/23/2024)  Received from [facility]   Epic    What is your living situation today?: I have a steady place to live    Think about the place you live. Do you have problems with any of the following? Choose all that apply:: None/None on this list  Utilities: Low Risk (05/23/2024)   Received from [facility]   Utilities    In the past 12 months has the electric, gas, oil, or water company threatened to shut off services in your home? : No  Health Literacy: Adequate Health Literacy (07/25/2024)   B1300 Health Literacy    Frequency of need for help with medical instructions: Never    ROS See HPI above    Objective  BP 120/82   Pulse 76   Temp 97.9 F (36.6 C) (Oral)   Ht 5' 4 (1.626 m)   Wt 228 lb (103.4 kg)   SpO2 98%   BMI 39.14 kg/m   Physical Exam Vitals reviewed.  Constitutional:      General: She is not in acute distress.    Appearance: Normal appearance. She is obese. She is not ill-appearing, toxic-appearing or diaphoretic.  HENT:     Head: Normocephalic and atraumatic.  Eyes:     General:        Right eye: No discharge.        Left eye: No discharge.     Conjunctiva/sclera: Conjunctivae normal.  Cardiovascular:     Rate and Rhythm: Normal rate and regular rhythm.     Heart sounds: Normal heart sounds. No murmur heard.    No friction rub. No gallop.  Pulmonary:     Effort: Pulmonary effort is normal. No respiratory distress.     Breath sounds: Normal breath sounds.  Musculoskeletal:        General: Normal range of motion.  Skin:    General: Skin is warm and dry.  Neurological:     General: No focal deficit present.     Mental Status: She is  alert and oriented to person, place, and time. Mental status is at baseline.  Psychiatric:        Mood and Affect: Mood normal.        Behavior: Behavior normal.        Thought Content: Thought content normal.        Judgment: Judgment normal.      Assessment & Plan:  Irritable bowel syndrome with both constipation and diarrhea Assessment & Plan: Stable. Continue Dicyclomine  20mg  daily. Refilled medication.   Orders: -     Lansoprazole ; Take 1 capsule (30 mg total) by mouth daily at 12 noon.  Dispense: 90 capsule; Refill: 3  Gastroesophageal reflux disease without esophagitis Assessment & Plan: Stable. Continue Lansoprazole  30mg  daily. Refilled medication.   Orders: -     Dicyclomine  HCl; Take 1 tablet (20 mg total) by mouth 4 (four) times daily -  before meals and at bedtime.  Dispense: 360 tablet; Refill: 3  Class 2 obesity due to excess calories without serious comorbidity with body mass index (BMI) of 39.0 to 39.9 in adult -     CBC with Differential/Platelet; Future -     Comprehensive metabolic panel with GFR; Future -     Hemoglobin A1c; Future -     Lipid panel; Future -     TSH; Future  Vitamin B12 deficiency -     Vitamin B12; Future  Encounter to establish care  1.Review health maintenance:  -Covid booster: Not  had most recent  -HIV and Hep C screening: More likely had previously -Hep B vaccine: More likely had previously  -HPV vaccine: Not had it  -Cervical Cancer screening: [id number]  -Mammogram: [id number]-2024; Declines to order at this time.  2.Continue all other medications that are either supplements or managed by [facility].  3.Ordered labs based on BMI-obesity and vitamin B12 deficiency. Patient is going to schedule an appointment when she can be hydrated with water and will be fasting. Office will call with lab results and will be available via MyChart.  Return in about 1 year (around 07/25/2025) for physical; Lab  appointment-fasting .   Kaiser Belluomini, NP  "
